# Patient Record
Sex: Male | Born: 1935 | Race: White | Hispanic: No
Health system: Midwestern US, Academic
[De-identification: ages and names within clinical notes are randomized; demographics above are authoritative.]

---

## 2018-01-04 ENCOUNTER — Encounter: Admit: 2018-01-04 | Discharge: 2018-01-04 | Payer: MEDICARE

## 2018-01-04 DIAGNOSIS — I4891 Unspecified atrial fibrillation: Principal | ICD-10-CM

## 2018-01-04 DIAGNOSIS — N400 Enlarged prostate without lower urinary tract symptoms: ICD-10-CM

## 2018-01-04 LAB — COMPREHENSIVE METABOLIC PANEL
Lab: 0.7 mg/dL — ABNORMAL LOW (ref 0.3–1.2)
Lab: 142 MMOL/L — ABNORMAL LOW (ref 137–147)
Lab: 142 U/L — ABNORMAL HIGH (ref 25–110)
Lab: 23 MMOL/L (ref 21–30)
Lab: 3.9 g/dL (ref 3.5–5.0)
Lab: 41 U/L — ABNORMAL HIGH (ref 7–40)
Lab: 56 U/L — ABNORMAL LOW (ref 7–56)
Lab: 8 K/UL (ref 3–12)
Lab: >60 mL/min (ref >60)
Lab: >60 mL/min (ref >60)

## 2018-01-04 LAB — MAGNESIUM: Lab: 2.2 mg/dL — ABNORMAL HIGH (ref 1.6–2.6)

## 2018-01-04 LAB — PROCALCITONIN: Lab: 0 ng/mL (ref <0.11)

## 2018-01-04 LAB — RETICULOCYTE COUNT: Lab: 2.2 % — ABNORMAL HIGH (ref 0.5–2.0)

## 2018-01-04 LAB — TSH WITH FREE T4 REFLEX: Lab: 1.7 uU/mL — ABNORMAL LOW (ref 0.35–5.00)

## 2018-01-04 LAB — PTT (APTT): Lab: 28 s — ABNORMAL LOW (ref 24.0–36.5)

## 2018-01-04 LAB — PHOSPHORUS: Lab: 3.6 mg/dL — ABNORMAL HIGH (ref 2.0–4.5)

## 2018-01-04 LAB — BNP (B-TYPE NATRIURETIC PEPTI): Lab: 546 pg/mL — ABNORMAL HIGH (ref 0–100)

## 2018-01-04 LAB — TROPONIN-I: Lab: 0 ng/mL (ref 0.0–0.05)

## 2018-01-04 LAB — VITAMIN B12: Lab: 324 pg/mL — ABNORMAL LOW (ref 180–914)

## 2018-01-04 LAB — CBC AND DIFF: Lab: 6.5 10*3/uL (ref 4.5–11.0)

## 2018-01-04 LAB — LACTIC ACID (BG - RAPID LACTATE): Lab: 1.4 MMOL/L (ref 0.5–2.0)

## 2018-01-04 LAB — FOLATE, SERUM: Lab: >22 ng/mL — ABNORMAL LOW (ref >3.9)

## 2018-01-04 LAB — IRON + BINDING CAPACITY + %SAT+ FERRITIN: Lab: 38 ug/dL — ABNORMAL LOW (ref 50–185)

## 2018-01-04 LAB — HEMOGLOBIN A1C: Lab: 6 % — ABNORMAL HIGH (ref 4.0–6.0)

## 2018-01-04 LAB — PROTIME INR (PT): Lab: 1.3 M/UL — ABNORMAL HIGH (ref 0.8–1.2)

## 2018-01-04 MED ORDER — FUROSEMIDE 10 MG/ML IJ SOLN
20 mg | Freq: Once | INTRAVENOUS | 0 refills | Status: CP
Start: 2018-01-04 — End: ?
  Administered 2018-01-05: 20 mg via INTRAVENOUS

## 2018-01-04 MED ORDER — SENNOSIDES-DOCUSATE SODIUM 8.6-50 MG PO TAB
2 | Freq: Two times a day (BID) | ORAL | 0 refills | Status: DC
Start: 2018-01-04 — End: 2018-01-09
  Administered 2018-01-05 – 2018-01-08 (×4): 2 via ORAL

## 2018-01-04 MED ORDER — POLYETHYLENE GLYCOL 3350 17 GRAM PO PWPK
2 | Freq: Two times a day (BID) | ORAL | 0 refills | Status: DC
Start: 2018-01-04 — End: 2018-01-09
  Administered 2018-01-05 – 2018-01-07 (×2): 34 g via ORAL

## 2018-01-04 MED ORDER — PERFLUTREN LIPID MICROSPHERES 1.1 MG/ML IV SUSP
1-20 mL | Freq: Once | INTRAVENOUS | 0 refills | Status: CP | PRN
Start: 2018-01-04 — End: ?
  Administered 2018-01-04: 21:00:00 2 mL via INTRAVENOUS

## 2018-01-04 MED ORDER — LIDOCAINE HCL 2 % MM JELP
TOPICAL | 0 refills | Status: DC | PRN
Start: 2018-01-04 — End: 2018-01-09
  Administered 2018-01-04: 23:00:00 2 mL via TOPICAL

## 2018-01-04 MED ORDER — ENOXAPARIN 40 MG/0.4 ML SC SYRG
40 mg | Freq: Every day | SUBCUTANEOUS | 0 refills | Status: DC
Start: 2018-01-04 — End: 2018-01-07
  Administered 2018-01-05 – 2018-01-07 (×3): 40 mg via SUBCUTANEOUS

## 2018-01-05 LAB — CREATININE-URINE RANDOM: Lab: 195 mg/dL (ref >60)

## 2018-01-05 LAB — RVP VIRAL PANEL PCR

## 2018-01-05 LAB — BASIC METABOLIC PANEL: Lab: 144 MMOL/L — ABNORMAL HIGH (ref 137–147)

## 2018-01-05 LAB — UREA NITROGEN-URINE RANDOM: Lab: 879 mg/dL

## 2018-01-05 LAB — TROPONIN-I
Lab: 0 ng/mL (ref 0.0–0.05)
Lab: 0 ng/mL (ref 0.0–0.05)

## 2018-01-05 LAB — PHOSPHORUS: Lab: 3.5 mg/dL — ABNORMAL HIGH (ref 2.0–4.5)

## 2018-01-05 LAB — MAGNESIUM: Lab: 2.2 mg/dL — ABNORMAL LOW (ref >60)

## 2018-01-05 LAB — PSA SCREEN: Lab: 18 ng/mL — ABNORMAL HIGH (ref <6.01)

## 2018-01-05 LAB — CBC AND DIFF: Lab: 5.8 K/UL — ABNORMAL HIGH (ref 4.5–11.0)

## 2018-01-05 LAB — COMPREHENSIVE METABOLIC PANEL: Lab: 145 MMOL/L — ABNORMAL LOW (ref 137–147)

## 2018-01-05 MED ORDER — AMLODIPINE 5 MG PO TAB
5 mg | Freq: Every day | ORAL | 0 refills | Status: DC
Start: 2018-01-05 — End: 2018-01-09
  Administered 2018-01-05 – 2018-01-09 (×5): 5 mg via ORAL

## 2018-01-05 MED ORDER — FUROSEMIDE 10 MG/ML IJ SOLN
20 mg | Freq: Once | INTRAVENOUS | 0 refills | Status: CP
Start: 2018-01-05 — End: ?
  Administered 2018-01-05: 14:00:00 20 mg via INTRAVENOUS

## 2018-01-05 MED ORDER — EMU OIL 120ML
TOPICAL | 0 refills | Status: DC | PRN
Start: 2018-01-05 — End: 2018-01-09
  Administered 2018-01-05: 19:00:00 120.000 mL via TOPICAL

## 2018-01-05 MED ORDER — DICLOFENAC SODIUM 1 % TP GEL
2 g | Freq: Four times a day (QID) | TOPICAL | 0 refills | Status: DC | PRN
Start: 2018-01-05 — End: 2018-01-06
  Administered 2018-01-06: 01:00:00 2 g via TOPICAL

## 2018-01-05 MED ORDER — AMLODIPINE 5 MG PO TAB
5 mg | Freq: Once | ORAL | 0 refills | Status: CP
Start: 2018-01-05 — End: ?
  Administered 2018-01-05: 23:00:00 5 mg via ORAL

## 2018-01-05 MED ORDER — BISACODYL 10 MG RE SUPP
10 mg | Freq: Once | RECTAL | 0 refills | Status: AC
Start: 2018-01-05 — End: ?

## 2018-01-05 MED ORDER — ACETAMINOPHEN 325 MG PO TAB
650 mg | ORAL | 0 refills | Status: DC | PRN
Start: 2018-01-05 — End: 2018-01-09
  Administered 2018-01-05 – 2018-01-09 (×5): 650 mg via ORAL

## 2018-01-06 ENCOUNTER — Inpatient Hospital Stay: Admit: 2018-01-06 | Discharge: 2018-01-06 | Payer: MEDICARE

## 2018-01-06 LAB — URINALYSIS MICROSCOPIC REFLEX TO CULTURE

## 2018-01-06 LAB — URINALYSIS DIPSTICK REFLEX TO CULTURE: Lab: NEGATIVE

## 2018-01-06 LAB — BASIC METABOLIC PANEL: Lab: 142 MMOL/L — ABNORMAL LOW (ref >60)

## 2018-01-06 LAB — CBC AND DIFF: Lab: 6.8 K/UL — ABNORMAL LOW (ref >60)

## 2018-01-06 LAB — PHOSPHORUS: Lab: 3.1 mg/dL — ABNORMAL LOW (ref 2.0–4.5)

## 2018-01-06 LAB — PROCALCITONIN: Lab: 0.1 ng/mL — ABNORMAL HIGH (ref <0.11)

## 2018-01-06 LAB — COMPREHENSIVE METABOLIC PANEL: Lab: 146 MMOL/L — ABNORMAL LOW (ref >60)

## 2018-01-06 LAB — MAGNESIUM: Lab: 1.9 mg/dL — ABNORMAL LOW (ref >60)

## 2018-01-06 MED ORDER — VANCOMYCIN 1G/250ML D5W IVPB (VIAL2BAG)
15 mg/kg | Freq: Once | INTRAVENOUS | 0 refills | Status: CP
Start: 2018-01-06 — End: ?
  Administered 2018-01-06 (×2): 1000 mg via INTRAVENOUS

## 2018-01-06 MED ORDER — MAGNESIUM SULFATE IN D5W 1 GRAM/100 ML IV PGBK
1 g | INTRAVENOUS | 0 refills | Status: CP
Start: 2018-01-06 — End: ?
  Administered 2018-01-06 (×2): 1 g via INTRAVENOUS

## 2018-01-06 MED ORDER — MELATONIN 5 MG PO TAB
5 mg | Freq: Every evening | ORAL | 0 refills | Status: DC
Start: 2018-01-06 — End: 2018-01-09
  Administered 2018-01-07 – 2018-01-09 (×3): 5 mg via ORAL

## 2018-01-06 MED ORDER — VANCOMYCIN 1G/250ML D5W IVPB (VIAL2BAG)
15 mg/kg | INTRAVENOUS | 0 refills | Status: DC
Start: 2018-01-06 — End: 2018-01-07
  Administered 2018-01-07 (×2): 1000 mg via INTRAVENOUS

## 2018-01-06 MED ORDER — POTASSIUM CHLORIDE 20 MEQ PO TBTQ
40 meq | Freq: Once | ORAL | 0 refills | Status: CP
Start: 2018-01-06 — End: ?
  Administered 2018-01-06: 15:00:00 40 meq via ORAL

## 2018-01-06 MED ORDER — HALOPERIDOL LACTATE 5 MG/ML IJ SOLN
1 mg | Freq: Once | INTRAVENOUS | 0 refills | Status: CP
Start: 2018-01-06 — End: ?

## 2018-01-06 MED ORDER — FUROSEMIDE 10 MG/ML IJ SOLN
40 mg | Freq: Once | INTRAVENOUS | 0 refills | Status: CP
Start: 2018-01-06 — End: ?
  Administered 2018-01-06: 40 mg via INTRAVENOUS

## 2018-01-06 MED ORDER — VANCOMYCIN 1G/250ML D5W IVPB (VIAL2BAG)
15 mg/kg | Freq: Two times a day (BID) | INTRAVENOUS | 0 refills | Status: DC
Start: 2018-01-06 — End: 2018-01-06

## 2018-01-06 MED ORDER — PIPERACILLIN/TAZOBACTAM 4.5 G/NS (MB+)(EXTENDED INFUSION)
4.5 g | INTRAVENOUS | 0 refills | Status: DC
Start: 2018-01-06 — End: 2018-01-07
  Administered 2018-01-06 – 2018-01-07 (×8): 4.5 g via INTRAVENOUS

## 2018-01-06 MED ORDER — FUROSEMIDE 10 MG/ML IJ SOLN
40 mg | Freq: Once | INTRAVENOUS | 0 refills | Status: CP
Start: 2018-01-06 — End: ?
  Administered 2018-01-06: 16:00:00 40 mg via INTRAVENOUS

## 2018-01-06 MED ORDER — VANCOMYCIN PHARMACY TO MANAGE
1 | 0 refills | Status: DC
Start: 2018-01-06 — End: 2018-01-07

## 2018-01-06 MED ORDER — VANCOMYCIN RANDOM DOSING
1 | INTRAVENOUS | 0 refills | Status: DC
Start: 2018-01-06 — End: 2018-01-06

## 2018-01-06 MED ORDER — PIPERACILLIN/TAZOBACTAM 4.5 G/NS IVPB (MB+)
4.5 g | Freq: Once | INTRAVENOUS | 0 refills | Status: CP
Start: 2018-01-06 — End: ?
  Administered 2018-01-06 (×2): 4.5 g via INTRAVENOUS

## 2018-01-06 MED ORDER — VANCOMYCIN PHARMACY TO MANAGE
1 | 0 refills | Status: DC
Start: 2018-01-06 — End: 2018-01-06

## 2018-01-06 MED ORDER — LORAZEPAM 2 MG/ML IJ SOLN
.25 mg | Freq: Once | INTRAVENOUS | 0 refills | Status: CP
Start: 2018-01-06 — End: ?

## 2018-01-06 MED ADMIN — HALOPERIDOL LACTATE 5 MG/ML IJ SOLN [3584]: 1 mg | INTRAVENOUS | @ 06:00:00 | Stop: 2018-01-06 | NDC 63323047400

## 2018-01-06 MED ADMIN — LORAZEPAM 2 MG/ML IJ SYRG [86485]: 0.25 mg | INTRAVENOUS | @ 09:00:00 | Stop: 2018-01-06 | NDC 00409198530

## 2018-01-07 LAB — CBC AND DIFF: Lab: 6.9 K/UL — ABNORMAL LOW (ref >60)

## 2018-01-07 LAB — MAGNESIUM: Lab: 2.4 mg/dL — ABNORMAL HIGH (ref 1.6–2.6)

## 2018-01-07 LAB — PHOSPHORUS: Lab: 3.7 mg/dL — ABNORMAL HIGH (ref 2.0–4.5)

## 2018-01-07 LAB — COMPREHENSIVE METABOLIC PANEL
Lab: 109 MMOL/L — ABNORMAL LOW (ref 98–110)
Lab: 145 MMOL/L — ABNORMAL LOW (ref >60)

## 2018-01-07 MED ORDER — ENOXAPARIN 30 MG/0.3 ML SC SYRG
30 mg | Freq: Every day | SUBCUTANEOUS | 0 refills | Status: DC
Start: 2018-01-07 — End: 2018-01-09
  Administered 2018-01-08 – 2018-01-09 (×2): 30 mg via SUBCUTANEOUS

## 2018-01-07 MED ORDER — PIPERACILLIN/TAZOBACTAM 3.375 G/NS IVPB (MB+)
3.375 g | INTRAVENOUS | 0 refills | Status: DC
Start: 2018-01-07 — End: 2018-01-07

## 2018-01-08 LAB — CBC
Lab: 19 % — ABNORMAL HIGH (ref 11–15)
Lab: 220 10*3/uL (ref 150–400)
Lab: 25 pg — ABNORMAL LOW (ref 26–34)
Lab: 29 % — ABNORMAL LOW (ref 40–50)
Lab: 3.5 M/UL — ABNORMAL LOW (ref 4.4–5.5)
Lab: 31 g/dL — ABNORMAL LOW (ref 32.0–36.0)
Lab: 6.2 10*3/uL (ref 4.5–11.0)
Lab: 6.5 FL — ABNORMAL LOW (ref 7–11)
Lab: 81 FL (ref 80–100)
Lab: 9.1 g/dL — ABNORMAL LOW (ref 13.5–16.5)

## 2018-01-08 LAB — MAGNESIUM: Lab: 2.3 mg/dL — ABNORMAL LOW (ref >60)

## 2018-01-08 LAB — COMPREHENSIVE METABOLIC PANEL
Lab: 143 MMOL/L — ABNORMAL LOW (ref >60)
Lab: 35 mg/dL — ABNORMAL HIGH (ref 7–25)
Lab: 94 mg/dL — ABNORMAL LOW (ref >60)

## 2018-01-08 LAB — PHOSPHORUS: Lab: 3.5 mg/dL — ABNORMAL HIGH (ref >60)

## 2018-01-08 LAB — CBC AND DIFF: Lab: 5.2 10*3/uL — ABNORMAL HIGH (ref >60)

## 2018-01-08 MED ORDER — ASPIRIN 81 MG PO CHEW
81 mg | Freq: Every day | ORAL | 0 refills | Status: DC
Start: 2018-01-08 — End: 2018-01-09
  Administered 2018-01-08 – 2018-01-09 (×2): 81 mg via ORAL

## 2018-01-09 ENCOUNTER — Encounter: Admit: 2018-01-09 | Discharge: 2018-01-09 | Payer: MEDICARE

## 2018-01-09 ENCOUNTER — Encounter
Admit: 2018-01-04 | Discharge: 2018-01-09 | Disposition: A | Discharge status: Skilled Nursing Facility | Payer: MEDICARE | Source: Other Acute Inpatient Hospital | Attending: Critical Care Medicine

## 2018-01-09 ENCOUNTER — Ambulatory Visit: Admit: 2018-01-09 | Discharge: 2018-01-10 | Payer: MEDICARE

## 2018-01-09 DIAGNOSIS — N179 Acute kidney failure, unspecified: ICD-10-CM

## 2018-01-09 DIAGNOSIS — R001 Bradycardia, unspecified: Principal | ICD-10-CM

## 2018-01-09 DIAGNOSIS — I700 Atherosclerosis of aorta: ICD-10-CM

## 2018-01-09 DIAGNOSIS — I472 Ventricular tachycardia: Secondary | ICD-10-CM

## 2018-01-09 DIAGNOSIS — K769 Liver disease, unspecified: Secondary | ICD-10-CM

## 2018-01-09 DIAGNOSIS — I110 Hypertensive heart disease with heart failure: Secondary | ICD-10-CM

## 2018-01-09 DIAGNOSIS — D509 Iron deficiency anemia, unspecified: ICD-10-CM

## 2018-01-09 DIAGNOSIS — N401 Enlarged prostate with lower urinary tract symptoms: ICD-10-CM

## 2018-01-09 DIAGNOSIS — R338 Other retention of urine: Secondary | ICD-10-CM

## 2018-01-09 DIAGNOSIS — Z7982 Long term (current) use of aspirin: ICD-10-CM

## 2018-01-09 DIAGNOSIS — N189 Chronic kidney disease, unspecified: Secondary | ICD-10-CM

## 2018-01-09 DIAGNOSIS — I130 Hypertensive heart and chronic kidney disease with heart failure and stage 1 through stage 4 chronic kidney disease, or unspecified chronic kidney disease: Secondary | ICD-10-CM

## 2018-01-09 DIAGNOSIS — I440 Atrioventricular block, first degree: ICD-10-CM

## 2018-01-09 DIAGNOSIS — I5033 Acute on chronic diastolic (congestive) heart failure: ICD-10-CM

## 2018-01-09 DIAGNOSIS — R7303 Prediabetes: ICD-10-CM

## 2018-01-09 DIAGNOSIS — I480 Paroxysmal atrial fibrillation: ICD-10-CM

## 2018-01-09 DIAGNOSIS — K5900 Constipation, unspecified: Secondary | ICD-10-CM

## 2018-01-09 LAB — CBC AND DIFF: Lab: 6.8 K/UL — ABNORMAL HIGH (ref >60)

## 2018-01-09 LAB — MAGNESIUM: Lab: 2.5 mg/dL — ABNORMAL LOW (ref 1.6–2.6)

## 2018-01-09 LAB — PHOSPHORUS: Lab: 3.1 mg/dL — ABNORMAL HIGH (ref 2.0–4.5)

## 2018-01-09 LAB — COMPREHENSIVE METABOLIC PANEL: Lab: 145 MMOL/L — ABNORMAL LOW (ref >60)

## 2018-01-09 MED ORDER — SENNOSIDES-DOCUSATE SODIUM 8.6-50 MG PO TAB
2 | Freq: Two times a day (BID) | ORAL | 0 refills | Status: AC
Start: 2018-01-09 — End: ?

## 2018-01-09 MED ORDER — FUROSEMIDE 40 MG PO TAB
40 mg | ORAL_TABLET | ORAL | 0 refills | 90.00000 days | Status: AC | PRN
Start: 2018-01-09 — End: ?

## 2018-01-09 MED ORDER — MELATONIN 5 MG PO TAB
5 mg | Freq: Every evening | ORAL | 0 refills | 28.00000 days | Status: AC
Start: 2018-01-09 — End: 2018-02-04

## 2018-01-09 MED ORDER — POLYETHYLENE GLYCOL 3350 17 GRAM PO PWPK
34 g | Freq: Two times a day (BID) | ORAL | 0 refills | 18.00000 days | Status: AC
Start: 2018-01-09 — End: ?

## 2018-01-09 MED ORDER — AMLODIPINE 5 MG PO TAB
5 mg | ORAL_TABLET | Freq: Every day | ORAL | 0 refills | Status: AC
Start: 2018-01-09 — End: ?
  Filled 2018-01-09: qty 30, 30d supply

## 2018-01-10 ENCOUNTER — Encounter: Admit: 2018-01-10 | Discharge: 2018-01-10 | Payer: MEDICARE

## 2018-01-12 LAB — CULTURE-BLOOD W/SENSITIVITY

## 2018-01-17 ENCOUNTER — Encounter: Admit: 2018-01-17 | Discharge: 2018-01-17 | Payer: MEDICARE

## 2018-01-17 DIAGNOSIS — I4891 Unspecified atrial fibrillation: Secondary | ICD-10-CM

## 2018-01-17 DIAGNOSIS — N400 Enlarged prostate without lower urinary tract symptoms: Secondary | ICD-10-CM

## 2018-01-28 ENCOUNTER — Encounter: Admit: 2018-01-28 | Discharge: 2018-01-28 | Payer: MEDICARE

## 2018-01-28 ENCOUNTER — Ambulatory Visit: Admit: 2018-01-28 | Discharge: 2018-01-29 | Payer: MEDICARE

## 2018-01-28 DIAGNOSIS — N400 Enlarged prostate without lower urinary tract symptoms: Secondary | ICD-10-CM

## 2018-01-28 DIAGNOSIS — I4891 Unspecified atrial fibrillation: Secondary | ICD-10-CM

## 2018-01-28 DIAGNOSIS — R001 Bradycardia, unspecified: Secondary | ICD-10-CM

## 2018-01-28 DIAGNOSIS — I5033 Acute on chronic diastolic (congestive) heart failure: Secondary | ICD-10-CM

## 2018-01-28 DIAGNOSIS — I480 Paroxysmal atrial fibrillation: Secondary | ICD-10-CM

## 2018-01-28 DIAGNOSIS — Z136 Encounter for screening for cardiovascular disorders: Secondary | ICD-10-CM

## 2018-01-30 ENCOUNTER — Encounter: Admit: 2018-01-30 | Discharge: 2018-01-30 | Payer: MEDICARE

## 2018-01-30 LAB — BASIC METABOLIC PANEL
Lab: 1 — ABNORMAL LOW (ref 30–34)
Lab: 108 — ABNORMAL HIGH (ref 65–99)
Lab: 110 — ABNORMAL HIGH (ref 98–107)
Lab: 141 — ABNORMAL LOW (ref 4.70–6.10)
Lab: 22 — ABNORMAL LOW (ref 23–31)
Lab: 30 — ABNORMAL HIGH (ref 8.4–25.7)
Lab: 4.3 — ABNORMAL LOW (ref 14–18)
Lab: 68 — ABNORMAL HIGH (ref 11.5–14.5)
Lab: 9

## 2018-01-30 LAB — CBC: Lab: 4.2 — ABNORMAL LOW (ref 4.8–10.8)

## 2018-01-30 LAB — MAGNESIUM: Lab: 2.2

## 2018-01-31 ENCOUNTER — Encounter: Admit: 2018-01-31 | Discharge: 2018-01-31 | Payer: MEDICARE

## 2018-02-03 ENCOUNTER — Encounter: Admit: 2018-02-03 | Discharge: 2018-02-03 | Payer: MEDICARE

## 2018-02-04 ENCOUNTER — Encounter: Admit: 2018-02-04 | Discharge: 2018-02-04 | Payer: MEDICARE

## 2018-02-04 ENCOUNTER — Ambulatory Visit: Admit: 2018-02-04 | Discharge: 2018-02-05 | Payer: MEDICARE

## 2018-02-04 DIAGNOSIS — N400 Enlarged prostate without lower urinary tract symptoms: Secondary | ICD-10-CM

## 2018-02-04 DIAGNOSIS — I4891 Unspecified atrial fibrillation: Secondary | ICD-10-CM

## 2018-02-04 DIAGNOSIS — R001 Bradycardia, unspecified: Secondary | ICD-10-CM

## 2018-02-04 MED ORDER — CEFAZOLIN INJ 1GM IVP
2 g | Freq: Once | INTRAVENOUS | 0 refills | Status: CN
Start: 2018-02-04 — End: ?

## 2018-02-04 MED ORDER — CEFAZOLIN INJ 1GM IVP
1 g | INTRAVENOUS | 0 refills | Status: CN
Start: 2018-02-04 — End: ?

## 2018-02-04 MED ORDER — LIDOCAINE (PF) 10 MG/ML (1 %) IJ SOLN
.1-2 mL | INTRAMUSCULAR | 0 refills | Status: CN | PRN
Start: 2018-02-04 — End: ?

## 2018-02-04 MED ORDER — SODIUM CHLORIDE 0.9 % IV SOLP
INTRAVENOUS | 0 refills | Status: CN
Start: 2018-02-04 — End: ?

## 2018-02-05 DIAGNOSIS — I491 Atrial premature depolarization: Secondary | ICD-10-CM

## 2018-02-05 DIAGNOSIS — R001 Bradycardia, unspecified: Secondary | ICD-10-CM

## 2018-02-05 DIAGNOSIS — R5383 Other fatigue: Secondary | ICD-10-CM

## 2018-02-05 DIAGNOSIS — I480 Paroxysmal atrial fibrillation: Secondary | ICD-10-CM

## 2018-02-05 DIAGNOSIS — I10 Essential (primary) hypertension: Secondary | ICD-10-CM

## 2018-02-05 DIAGNOSIS — I495 Sick sinus syndrome: Secondary | ICD-10-CM

## 2018-02-05 DIAGNOSIS — I5032 Chronic diastolic (congestive) heart failure: Secondary | ICD-10-CM

## 2018-02-06 ENCOUNTER — Encounter: Admit: 2018-02-06 | Discharge: 2018-02-06 | Payer: MEDICARE

## 2018-02-06 MED ORDER — MAGNESIUM HYDROXIDE 2,400 MG/10 ML PO SUSP
10 mL | ORAL | 0 refills | Status: CN | PRN
Start: 2018-02-06 — End: ?

## 2018-02-06 MED ORDER — ACETAMINOPHEN 325 MG PO TAB
650 mg | ORAL | 0 refills | Status: CN | PRN
Start: 2018-02-06 — End: ?

## 2018-02-10 ENCOUNTER — Encounter: Admit: 2018-02-10 | Discharge: 2018-02-10 | Payer: MEDICARE

## 2018-02-10 ENCOUNTER — Encounter: Admit: 2018-02-10 | Discharge: 2018-02-11 | Payer: MEDICARE

## 2018-02-10 DIAGNOSIS — I4891 Unspecified atrial fibrillation: Principal | ICD-10-CM

## 2018-02-10 DIAGNOSIS — N400 Enlarged prostate without lower urinary tract symptoms: ICD-10-CM

## 2018-02-10 MED ORDER — MAGNESIUM HYDROXIDE 2,400 MG/10 ML PO SUSP
10 mL | ORAL | 0 refills | Status: DC | PRN
Start: 2018-02-10 — End: 2018-02-11

## 2018-02-10 MED ORDER — ACETAMINOPHEN 325 MG PO TAB
650 mg | ORAL | 0 refills | Status: DC | PRN
Start: 2018-02-10 — End: 2018-02-11
  Administered 2018-02-10: 23:00:00 650 mg via ORAL

## 2018-02-10 MED ORDER — ASPIRIN 81 MG PO TBEC
81 mg | Freq: Every day | ORAL | 0 refills | Status: DC
Start: 2018-02-10 — End: 2018-02-11
  Administered 2018-02-10: 23:00:00 81 mg via ORAL

## 2018-02-10 MED ORDER — SODIUM CHLORIDE 0.9 % IV SOLP
INTRAVENOUS | 0 refills | Status: DC
Start: 2018-02-10 — End: 2018-02-11
  Administered 2018-02-10: 16:00:00 1000 mL via INTRAVENOUS

## 2018-02-10 MED ORDER — CEFAZOLIN INJ 1GM IVP
2 g | Freq: Once | INTRAVENOUS | 0 refills | Status: CP
Start: 2018-02-10 — End: ?

## 2018-02-10 MED ORDER — CEFAZOLIN INJ 1GM IVP
1 g | INTRAVENOUS | 0 refills | Status: CP
Start: 2018-02-10 — End: ?
  Administered 2018-02-11: 03:00:00 1 g via INTRAVENOUS

## 2018-02-10 MED ORDER — AMLODIPINE 5 MG PO TAB
5 mg | Freq: Two times a day (BID) | ORAL | 0 refills | Status: DC
Start: 2018-02-10 — End: 2018-02-11
  Administered 2018-02-10: 23:00:00 5 mg via ORAL

## 2018-02-10 MED ORDER — LIDOCAINE (PF) 10 MG/ML (1 %) IJ SOLN
.1-2 mL | INTRAMUSCULAR | 0 refills | Status: DC | PRN
Start: 2018-02-10 — End: 2018-02-11

## 2018-02-10 MED ORDER — POLYETHYLENE GLYCOL 3350 17 GRAM PO PWPK
34 g | Freq: Two times a day (BID) | ORAL | 0 refills | Status: DC
Start: 2018-02-10 — End: 2018-02-11

## 2018-02-10 MED ORDER — SENNOSIDES-DOCUSATE SODIUM 8.6-50 MG PO TAB
2 | Freq: Two times a day (BID) | ORAL | 0 refills | Status: DC
Start: 2018-02-10 — End: 2018-02-11
  Administered 2018-02-11: 03:00:00 2 via ORAL

## 2018-02-11 ENCOUNTER — Encounter: Admit: 2018-02-11 | Discharge: 2018-02-12 | Payer: MEDICARE

## 2018-02-11 ENCOUNTER — Encounter: Admit: 2018-02-11 | Discharge: 2018-02-11 | Payer: MEDICARE

## 2018-02-11 DIAGNOSIS — I495 Sick sinus syndrome: Principal | ICD-10-CM

## 2018-02-11 DIAGNOSIS — R55 Syncope and collapse: Secondary | ICD-10-CM

## 2018-02-11 DIAGNOSIS — R001 Bradycardia, unspecified: Secondary | ICD-10-CM

## 2018-02-11 LAB — BASIC METABOLIC PANEL
Lab: 0.9 mg/dL (ref 0.4–1.24)
Lab: 109 MMOL/L — ABNORMAL LOW (ref 98–110)
Lab: 139 MMOL/L — ABNORMAL LOW (ref 137–147)
Lab: 4.2 MMOL/L — ABNORMAL LOW (ref 3.5–5.1)
Lab: 5 pg (ref 3–12)
Lab: 8.2 mg/dL — ABNORMAL LOW (ref 8.5–10.6)
Lab: 85 mg/dL (ref 70–100)

## 2018-02-11 LAB — CBC
Lab: 21 % — ABNORMAL HIGH (ref 11–15)
Lab: 4.5 10*3/uL (ref 4.5–11.0)
Lab: 83 FL (ref 80–100)

## 2018-02-19 ENCOUNTER — Encounter: Admit: 2018-02-19 | Discharge: 2018-02-19 | Payer: MEDICARE

## 2018-02-21 ENCOUNTER — Encounter: Admit: 2018-02-21 | Discharge: 2018-02-21 | Payer: MEDICARE

## 2018-02-21 NOTE — Progress Notes
Removed Silverlon dressing. (Left or right) prepectoral incision is clean, dry, well approximated, and healing without evidence of drainage or discharge. Steri-Strips dry and intact. Pt reports no adverse symptoms. Incision care, including signs and symptoms of infection, reviewed(as below). Pt verbalized understanding and will remain in phone contact.    You may shower once dressing removed at follow up appointment; however avoid direct contact with the incision (allow the water to hit the back of your shoulder rather than directly on the incision).    Do not submerge incision in tub, pool, hot tub, or lake for 4 weeks.    Unless your incision is bleeding or draining, keep it open to air.    Avoid applying deodorants, powders, creams, lotions, etc. to your incision for 4 weeks.    Usually there are no stitches to be removed. Steri-strips will begin to fall off in 10-14 days. If they remain after 2 weeks, gently remove them when they are damp after a shower.    Your incision should gradually look better each day. Please notify our office immediately if you notice any of the following:   -an increase in swelling or redness   -any drainage   -increasing pain at the incision site  -fever over 100 degree

## 2018-03-20 ENCOUNTER — Encounter: Admit: 2018-03-20 | Discharge: 2018-03-20 | Payer: MEDICARE

## 2018-03-20 DIAGNOSIS — I480 Paroxysmal atrial fibrillation: ICD-10-CM

## 2018-03-20 DIAGNOSIS — Z950 Presence of cardiac pacemaker: ICD-10-CM

## 2018-03-20 DIAGNOSIS — I5032 Chronic diastolic (congestive) heart failure: ICD-10-CM

## 2018-03-20 DIAGNOSIS — I495 Sick sinus syndrome: Principal | ICD-10-CM

## 2018-03-24 ENCOUNTER — Ambulatory Visit: Admit: 2018-03-24 | Discharge: 2018-03-24 | Payer: MEDICARE

## 2018-03-24 ENCOUNTER — Encounter: Admit: 2018-03-24 | Discharge: 2018-03-24 | Payer: MEDICARE

## 2018-03-24 DIAGNOSIS — I5032 Chronic diastolic (congestive) heart failure: ICD-10-CM

## 2018-03-24 DIAGNOSIS — Z950 Presence of cardiac pacemaker: ICD-10-CM

## 2018-03-24 DIAGNOSIS — I495 Sick sinus syndrome: Principal | ICD-10-CM

## 2018-03-24 DIAGNOSIS — I480 Paroxysmal atrial fibrillation: ICD-10-CM

## 2018-03-26 ENCOUNTER — Encounter: Admit: 2018-03-26 | Discharge: 2018-03-26 | Payer: MEDICARE

## 2018-03-26 MED ORDER — APIXABAN 5 MG PO TAB
5 mg | ORAL_TABLET | Freq: Two times a day (BID) | ORAL | 11 refills | Status: AC
Start: 2018-03-26 — End: 2018-04-24

## 2018-04-02 ENCOUNTER — Encounter: Admit: 2018-04-02 | Discharge: 2018-04-02 | Payer: MEDICARE

## 2018-04-17 ENCOUNTER — Encounter: Admit: 2018-04-17 | Discharge: 2018-04-17 | Payer: MEDICARE

## 2018-04-21 ENCOUNTER — Encounter: Admit: 2018-04-21 | Discharge: 2018-04-21 | Payer: MEDICARE

## 2018-04-23 ENCOUNTER — Encounter: Admit: 2018-04-23 | Discharge: 2018-04-23 | Payer: MEDICARE

## 2018-04-24 ENCOUNTER — Ambulatory Visit: Admit: 2018-04-24 | Discharge: 2018-04-25 | Payer: MEDICARE

## 2018-04-24 ENCOUNTER — Encounter: Admit: 2018-04-24 | Discharge: 2018-04-24 | Payer: MEDICARE

## 2018-04-24 DIAGNOSIS — Z950 Presence of cardiac pacemaker: Principal | ICD-10-CM

## 2018-04-24 DIAGNOSIS — I4891 Unspecified atrial fibrillation: Principal | ICD-10-CM

## 2018-04-24 DIAGNOSIS — I480 Paroxysmal atrial fibrillation: ICD-10-CM

## 2018-04-24 DIAGNOSIS — I495 Sick sinus syndrome: ICD-10-CM

## 2018-04-24 DIAGNOSIS — N400 Enlarged prostate without lower urinary tract symptoms: ICD-10-CM

## 2018-04-24 DIAGNOSIS — I5032 Chronic diastolic (congestive) heart failure: ICD-10-CM

## 2018-04-24 NOTE — Progress Notes
Date of Service: 04/24/2018    Edward Santana is a 85 y.o. male.       HPI     Edward Santana was on Zoom today with his daughter from [place].  He had a pacemaker implanted a couple of months ago for what we thought was symptomatic sinus node dysfunction.  He did fine with the procedure and the incision has healed normally.      Edward Santana is a bit of a reluctant patient and I can't get him to say that he feels any different after the implant.  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  He's entirely unaware of his atrial fibrillation.  He hasn't had any TIA or stroke symptoms.  He still adamantly refuses to consider oral anti-coagulation.         Vitals:    04/24/18 1431   BP: 128/75   BP Source: Arm, Left Upper   Pulse: 94   SpO2: 97%   Weight: 64.9 kg (143 lb)   Height: 1.727 m (5' 8)   PainSc: Zero     Body mass index is 21.74 kg/m???.     Past Medical History  Patient Active Problem List    Diagnosis Date Noted   ??? Sinus node dysfunction (HCC) 02/04/2018     02/10/2018: Dual chamber pacemaker placement under moderate sedation (left chest St Jude) by Dr Bernette Mayers     ??? Screening for cardiovascular condition 01/28/2018     08/2016 - MPI @ [facility]:  EF 63%.  No ischemia.     ??? Bradycardia 01/04/2018     02/10/2018: Dual chamber pacemaker placement under moderate sedation (left chest St Jude) by Dr Bernette Mayers     ??? Atrial fibrillation [facility]) 01/04/2018     08/2016 - EKG @ [facility] showed AF, rate 90  01/2017 - AF noted during [facility] OV.  A/C refused.  07/2017 - AF during office visit at [facility], patient refused A/C     ??? Chronic diastolic heart failure [facility]) 01/04/2018     08/2016 - Echo @ [facility]:  EF 55%.  Mild MR.  PAP 26.  LA size normal     ??? HTN (hypertension) 01/04/2018   ??? Pre-diabetes 01/04/2018   ??? Iron deficiency anemia 01/04/2018         Review of Systems   Constitution: Negative.

## 2018-06-10 ENCOUNTER — Encounter: Admit: 2018-06-10 | Discharge: 2018-06-10

## 2018-06-10 ENCOUNTER — Ambulatory Visit: Admit: 2018-06-10 | Discharge: 2018-06-10

## 2018-06-10 DIAGNOSIS — I480 Paroxysmal atrial fibrillation: Secondary | ICD-10-CM

## 2018-06-10 DIAGNOSIS — Z950 Presence of cardiac pacemaker: Secondary | ICD-10-CM

## 2018-06-10 DIAGNOSIS — I5032 Chronic diastolic (congestive) heart failure: Secondary | ICD-10-CM

## 2018-06-10 DIAGNOSIS — I495 Sick sinus syndrome: Principal | ICD-10-CM

## 2018-09-09 ENCOUNTER — Encounter: Admit: 2018-09-09 | Discharge: 2018-09-09

## 2018-09-09 DIAGNOSIS — I480 Paroxysmal atrial fibrillation: Secondary | ICD-10-CM

## 2018-09-09 DIAGNOSIS — Z950 Presence of cardiac pacemaker: Secondary | ICD-10-CM

## 2018-09-09 DIAGNOSIS — I495 Sick sinus syndrome: Secondary | ICD-10-CM

## 2018-09-10 ENCOUNTER — Ambulatory Visit: Admit: 2018-09-09 | Discharge: 2018-09-10

## 2018-09-10 DIAGNOSIS — I5032 Chronic diastolic (congestive) heart failure: Secondary | ICD-10-CM

## 2018-10-23 ENCOUNTER — Encounter: Admit: 2018-10-23 | Discharge: 2018-10-23 | Payer: MEDICARE

## 2018-10-23 ENCOUNTER — Ambulatory Visit: Admit: 2018-10-23 | Discharge: 2018-10-23 | Payer: MEDICARE

## 2018-10-23 DIAGNOSIS — Z950 Presence of cardiac pacemaker: Secondary | ICD-10-CM

## 2018-11-18 ENCOUNTER — Encounter: Admit: 2018-11-18 | Discharge: 2018-11-18 | Payer: MEDICARE

## 2018-11-18 DIAGNOSIS — I480 Paroxysmal atrial fibrillation: Secondary | ICD-10-CM

## 2018-11-18 DIAGNOSIS — I5032 Chronic diastolic (congestive) heart failure: Secondary | ICD-10-CM

## 2018-11-18 DIAGNOSIS — N400 Enlarged prostate without lower urinary tract symptoms: Secondary | ICD-10-CM

## 2018-11-18 DIAGNOSIS — Z950 Presence of cardiac pacemaker: Secondary | ICD-10-CM

## 2018-11-18 DIAGNOSIS — I10 Essential (primary) hypertension: Secondary | ICD-10-CM

## 2018-11-18 DIAGNOSIS — I4891 Unspecified atrial fibrillation: Secondary | ICD-10-CM

## 2018-11-18 NOTE — Assessment & Plan Note
Blood pressure appears to be fine on the current medical program.

## 2018-11-18 NOTE — Assessment & Plan Note
No problems with dyspnea or peripheral edema.  Stable volume status.

## 2018-11-18 NOTE — Progress Notes
Date of Service: 11/18/2018    Derrich Gaby is a 85 y.o. male.       HPI     Lacorey was in the [place] clinic today with a daughter-in-law.  He is as reserved and rather aloof as always, but denies any problems with lightheadedness or syncope.  He has had no TIA or stroke symptoms.  He denies any palpitations or chest discomfort.         Vitals:    11/18/18 0852 11/18/18 0859   BP: 130/78 132/84   BP Source: Arm, Left Upper Arm, Right Upper   Pulse: 72    Temp: 36.4 ?C (97.5 ?F)    SpO2: 98%    Weight: 59.8 kg (131 lb 12.8 oz)    Height: 1.727 m (5' 8)    PainSc: Zero      Body mass index is 20.04 kg/m?Marland Kitchen     Past Medical History  Patient Active Problem List    Diagnosis Date Noted   ? Cardiac pacemaker 02/04/2018     02/10/2018: Dual chamber pacemaker placement under moderate sedation (left chest St Jude) by Dr Bernette Mayers     ? Screening for cardiovascular condition 01/28/2018     08/2016 - MPI @ [facility]:  EF 63%.  No ischemia.     ? Bradycardia 01/04/2018     02/10/2018: Dual chamber pacemaker placement under moderate sedation (left chest St Jude) by Dr Bernette Mayers     ? Atrial fibrillation (HCC) 01/04/2018     08/2016 - EKG @ [facility] showed AF, rate 90  01/2017 - AF noted during [facility] OV.  A/C refused.  07/2017 - AF during office visit at [facility], patient refused A/C     ? Chronic diastolic heart failure (HCC) 01/04/2018     08/2016 - Echo @ [facility]:  EF 55%.  Mild MR.  PAP 26.  LA size normal     ? HTN (hypertension) 01/04/2018   ? Pre-diabetes 01/04/2018   ? Iron deficiency anemia 01/04/2018         Review of Systems   Constitution: Negative.   HENT: Negative.    Eyes: Negative.    Cardiovascular: Negative.    Respiratory: Negative.    Endocrine: Negative.    Hematologic/Lymphatic: Negative.    Skin: Negative.    Musculoskeletal: Positive for arthritis and joint pain.   Gastrointestinal: Negative.    Genitourinary: Negative.    Neurological: Negative. Psychiatric/Behavioral: Negative.    Allergic/Immunologic: Negative.        Physical Exam    Physical Exam   General Appearance: no distress   Skin: warm, no ulcers or xanthomas   Digits and Nails: no cyanosis or clubbing   Eyes: conjunctivae and lids normal, pupils are equal and round   Teeth/Gums/Palate: dentition unremarkable, no lesions   Lips & Oral Mucosa: no pallor or cyanosis   Neck Veins: normal JVP , neck veins are not distended   Thyroid: no nodules, masses, tenderness or enlargement   Chest Inspection: chest is normal in appearance   Respiratory Effort: breathing comfortably, no respiratory distress   Auscultation/Percussion: lungs clear to auscultation, no rales or rhonchi, no wheezing   PMI: PMI not enlarged or displaced   Cardiac Rhythm: regular rhythm and normal rate   Cardiac Auscultation: S1, S2 normal, no rub, no gallop   Murmurs: no murmur   Peripheral Circulation: normal peripheral circulation   Carotid Arteries: normal carotid upstroke bilaterally, no bruits  Radial Arteries: normal symmetric radial pulses   Abdominal Aorta: no abdominal aortic bruit   Pedal Pulses: normal symmetric pedal pulses   Lower Extremity Edema: no lower extremity edema   Abdominal Exam: soft, non-tender, no masses, bowel sounds normal   Liver & Spleen: no organomegaly   Gait & Station: walks without assistance   Muscle Strength: normal muscle tone   Orientation: oriented to time, place and person   Affect & Mood: appropriate and sustained affect   Language and Memory: patient responsive and seems to comprehend information   Neurologic Exam: neurological assessment grossly intact   Other: moves all extremities      Problems Addressed Today  Encounter Diagnoses   Name Primary?   ? Chronic diastolic heart failure (HCC)    ? Paroxysmal atrial fibrillation (HCC)    ? Essential hypertension    ? Cardiac pacemaker        Assessment and Plan       Chronic diastolic heart failure (HCC) No problems with dyspnea or peripheral edema.  Stable volume status.    Atrial fibrillation [facility])  He is in atrial fibrillation about 70% of the time according to his pacemaker check but he has consistently refused anticoagulation.    HTN (hypertension)  Blood pressure appears to be fine on the current medical program.    Cardiac pacemaker  He had a pacemaker check about a month ago that shows normal device function.      Current Medications (including today's revisions)  ? acetaminophen SR (TYLENOL) 650 mg tablet Take 650 mg by mouth daily.   ? amLODIPine (NORVASC) 5 mg tablet Take one tablet by mouth daily.   ? aspirin EC 81 mg tablet Take 81 mg by mouth daily. Take with food.   ? CALCIUM PO Take 1 tablet by mouth daily.   ? cholecalciferol (VITAMIN D-3) 125 mcg (5,000 unit) tablet Take 5,000 Units by mouth every 7 days.   ? diclofenac (VOLTAREN) 1 % topical gel Apply  topically to affected area twice daily.   ? ferrous sulfate (FEOSOL) 325 mg (65 mg iron) tablet Take 325 mg by mouth twice daily. Take on an empty stomach at least 1 hour before or 2 hours after food.   ? furosemide (LASIX) 40 mg tablet Take one tablet by mouth as Needed. For weight gain of 2 lbs in one day or 5 pounds over 3 days   ? MULTIVITAMIN PO Take 1 tablet by mouth daily.   ? polyethylene glycol 3350 (MIRALAX) 17 g packet Take two packets by mouth twice daily. (Patient taking differently: Take 17 g by mouth daily.)   ? potassium chloride SR (K-DUR) 20 mEq tablet Take 20 mEq by mouth as Needed (Take 1 tablet if taking Lasix). Take with a meal and a full glass of water.   ? senna/docusate (SENOKOT-S) 8.6/50 mg tablet Take two tablets by mouth twice daily.

## 2018-11-18 NOTE — Assessment & Plan Note
He had a pacemaker check about a month ago that shows normal device function.

## 2018-11-18 NOTE — Assessment & Plan Note
He is in atrial fibrillation about 70% of the time according to his pacemaker check but he has consistently refused anticoagulation.

## 2019-01-21 ENCOUNTER — Encounter: Admit: 2019-01-21 | Discharge: 2019-01-21 | Payer: MEDICARE

## 2019-01-23 ENCOUNTER — Ambulatory Visit: Admit: 2019-01-23 | Discharge: 2019-01-23 | Payer: MEDICARE

## 2019-01-23 DIAGNOSIS — Z950 Presence of cardiac pacemaker: Secondary | ICD-10-CM

## 2019-01-23 DIAGNOSIS — I495 Sick sinus syndrome: Secondary | ICD-10-CM

## 2019-01-23 DIAGNOSIS — I480 Paroxysmal atrial fibrillation: Secondary | ICD-10-CM

## 2019-04-22 ENCOUNTER — Encounter: Admit: 2019-04-22 | Discharge: 2019-04-22 | Payer: MEDICARE

## 2019-07-22 ENCOUNTER — Encounter: Admit: 2019-07-22 | Discharge: 2019-07-22 | Payer: MEDICARE

## 2019-10-20 ENCOUNTER — Encounter: Admit: 2019-10-20 | Discharge: 2019-10-20 | Payer: MEDICARE

## 2020-01-19 NOTE — Assessment & Plan Note
His pacemaker was interrogated by remote transmission in mid October and his estimated battery longevity is about 9 years.  Usually when the device emits an audible warning it has to do with battery longevity.  His granddaughter is not even certain that the sound came from his pacemaker.

## 2020-01-19 NOTE — Assessment & Plan Note
His fluid status seems to be stable and he denies any symptoms related to heart failure.

## 2020-01-19 NOTE — Assessment & Plan Note
His pacemaker shows that he remains in atrial fibrillation.  He refuses anticoagulation.  He refuses a left atrial appendage closure device.  He is apparently asymptomatic.

## 2020-01-19 NOTE — Progress Notes
Date of Service: 01/19/2020    Edward Santana is a 87 y.o. male.       HPI     Edward Santana was in the [place] clinic today for follow-up regarding his permanent atrial fibrillation and pacemaker.  He is unvaccinated.  He may have had COVID in July when the rest of his family was ill.    His granddaughter accompanies him today and does not indicate any problems that he is experienced.    She says that he was walking into a football game in September, exertion that was definitely more than what he usually performs, and she thought that she heard his pacemaker making a beeping sound.  He did not seem to have any symptoms at the time and she is never heard the sound since then.    As always, he has very little to say and denies any sort of symptoms such as breathlessness, peripheral edema, or chest discomfort.  He denies any syncope or near syncope and he has had no TIA or stroke symptoms that he will admit to.       Vitals:    01/19/20 0821   BP: 132/72   BP Source: Arm, Left Upper   Patient Position: Sitting   Pulse: 82   SpO2: 97%   Weight: 59.1 kg (130 lb 6.4 oz)   Height: 1.575 m (5' 2)   PainSc: Zero     Body mass index is 23.85 kg/m?Marland Kitchen     Past Medical History  Patient Active Problem List    Diagnosis Date Noted   ? Cardiac pacemaker 02/04/2018     02/10/2018: Dual chamber pacemaker placement under moderate sedation (left chest St Jude) by Dr Bernette Mayers     ? Screening for cardiovascular condition 01/28/2018     08/2016 - MPI @ [facility]:  EF 63%.  No ischemia.     ? Bradycardia 01/04/2018     02/10/2018: Dual chamber pacemaker placement under moderate sedation (left chest St Jude) by Dr Bernette Mayers     ? Atrial fibrillation (HCC) 01/04/2018     08/2016 - EKG @ [facility] showed AF, rate 90  01/2017 - AF noted during [facility] OV.  A/C refused.  07/2017 - AF during office visit at [facility], patient refused A/C     ? Chronic diastolic heart failure (HCC) 01/04/2018     08/2016 - Echo @ [facility]:  EF 55%.  Mild MR.  PAP 26.  LA size normal     ? HTN (hypertension) 01/04/2018   ? Pre-diabetes 01/04/2018   ? Iron deficiency anemia 01/04/2018         Review of Systems   Constitutional: Negative.   HENT: Negative.    Eyes: Negative.    Cardiovascular: Negative.    Respiratory: Negative.    Endocrine: Negative.    Hematologic/Lymphatic: Negative.    Skin: Negative.    Musculoskeletal: Negative.    Gastrointestinal: Negative.    Genitourinary: Negative.    Neurological: Negative.    Psychiatric/Behavioral: Negative.    Allergic/Immunologic: Negative.        Physical Exam    Physical Exam   General Appearance: no distress   Skin: warm, no ulcers or xanthomas   Digits and Nails: no cyanosis or clubbing   Eyes: conjunctivae and lids normal, pupils are equal and round   Teeth/Gums/Palate: dentition unremarkable, no lesions   Lips & Oral Mucosa: no pallor or cyanosis   Neck Veins: normal JVP ,  neck veins are not distended   Thyroid: no nodules, masses, tenderness or enlargement   Chest Inspection: chest is normal in appearance   Respiratory Effort: breathing comfortably, no respiratory distress   Auscultation/Percussion: lungs clear to auscultation, no rales or rhonchi, no wheezing   PMI: PMI not enlarged or displaced   Cardiac Rhythm: regular rhythm and normal rate   Cardiac Auscultation: S1, S2 normal, no rub, no gallop   Murmurs: no murmur   Peripheral Circulation: normal peripheral circulation   Carotid Arteries: normal carotid upstroke bilaterally, no bruits   Radial Arteries: normal symmetric radial pulses   Abdominal Aorta: no abdominal aortic bruit   Pedal Pulses: normal symmetric pedal pulses   Lower Extremity Edema: no lower extremity edema   Abdominal Exam: soft, non-tender, no masses, bowel sounds normal   Liver & Spleen: no organomegaly   Gait & Station: walks without assistance   Muscle Strength: normal muscle tone   Orientation: oriented to time, place and person   Affect & Mood: appropriate and sustained affect   Language and Memory: patient responsive and seems to comprehend information   Neurologic Exam: neurological assessment grossly intact   Other: moves all extremities  His device shows that he remains in atrial fibrillation.  He refuses anticoagulation.    Cardiovascular Health Factors  Vitals BP Readings from Last 3 Encounters:   01/19/20 132/72   11/18/18 132/84   04/24/18 128/75     Wt Readings from Last 3 Encounters:   01/19/20 59.1 kg (130 lb 6.4 oz)   11/18/18 59.8 kg (131 lb 12.8 oz)   04/24/18 64.9 kg (143 lb)     BMI Readings from Last 3 Encounters:   01/19/20 23.85 kg/m?   11/18/18 20.04 kg/m?   04/24/18 21.74 kg/m?      Smoking Social History     Tobacco Use   Smoking Status Never Smoker   Smokeless Tobacco Never Used      Lipid Profile Cholesterol   Date Value Ref Range Status   01/06/2020 178  Final     HDL   Date Value Ref Range Status   01/06/2020 47  Final     LDL   Date Value Ref Range Status   01/06/2020 115 (H) <100 Final     Triglycerides   Date Value Ref Range Status   01/06/2020 80  Final      Blood Sugar Hemoglobin A1C   Date Value Ref Range Status   01/04/2018 6.0 4.0 - 6.0 % Final     Comment:     The ADA recommends that most patients with type 1 and type 2 diabetes maintain   an A1c level <7%.       Glucose   Date Value Ref Range Status   01/06/2020 104  Final   02/11/2018 85 70 - 100 MG/DL Final   16/10/9602 540 (H) 65 - 99 Final          Problems Addressed Today  Encounter Diagnoses   Name Primary?   ? Chronic diastolic heart failure (HCC) Yes   ? Paroxysmal atrial fibrillation (HCC)    ? Bradycardia    ? Cardiac pacemaker    ? Primary hypertension        Assessment and Plan       Atrial fibrillation [facility])  His pacemaker shows that he remains in atrial fibrillation.  He refuses anticoagulation.  He refuses a left atrial appendage closure device.  He is apparently asymptomatic.  Chronic diastolic heart failure (HCC)  His fluid status seems to be stable and he denies any symptoms related to heart failure.    HTN (hypertension)  His blood pressure looks fine today.    Cardiac pacemaker  His pacemaker was interrogated by remote transmission in mid October and his estimated battery longevity is about 9 years.  Usually when the device emits an audible warning it has to do with battery longevity.  His granddaughter is not even certain that the sound came from his pacemaker.      Current Medications (including today's revisions)  ? acetaminophen SR (TYLENOL) 650 mg tablet Take 650 mg by mouth daily.   ? amLODIPine (NORVASC) 5 mg tablet Take one tablet by mouth daily.   ? aspirin EC 81 mg tablet Take 81 mg by mouth daily. Take with food.   ? CALCIUM PO Take 1 tablet by mouth daily.   ? cholecalciferol (VITAMIN D-3) 125 mcg (5,000 unit) tablet Take 5,000 Units by mouth every 7 days.   ? diclofenac (VOLTAREN) 1 % topical gel Apply  topically to affected area twice daily.   ? ferrous sulfate (FEOSOL) 325 mg (65 mg iron) tablet Take 325 mg by mouth twice daily. Take on an empty stomach at least 1 hour before or 2 hours after food.   ? furosemide (LASIX) 40 mg tablet Take one tablet by mouth as Needed. For weight gain of 2 lbs in one day or 5 pounds over 3 days   ? MULTIVITAMIN PO Take 1 tablet by mouth daily.   ? polyethylene glycol 3350 (MIRALAX) 17 g packet Take two packets by mouth twice daily. (Patient taking differently: Take 17 g by mouth daily.)   ? potassium chloride SR (K-DUR) 20 mEq tablet Take 20 mEq by mouth as Needed (Take 1 tablet if taking Lasix). Take with a meal and a full glass of water.   ? senna/docusate (SENOKOT-S) 8.6/50 mg tablet Take two tablets by mouth twice daily.     Total time spent on today's office visit was 30 minutes.  This includes face-to-face in person visit with patient as well as nonface-to-face time including review of the EMR, outside records, labs, radiologic studies, echocardiogram & other cardiovascular studies, formation of treatment plan, after visit summary, future disposition, and lastly on documentation.

## 2020-01-19 NOTE — Assessment & Plan Note
His blood pressure looks fine today.

## 2020-01-21 ENCOUNTER — Encounter: Admit: 2020-01-21 | Discharge: 2020-01-21 | Payer: MEDICARE

## 2020-04-21 ENCOUNTER — Encounter: Admit: 2020-04-21 | Discharge: 2020-04-21 | Payer: MEDICARE

## 2020-04-21 DIAGNOSIS — Z950 Presence of cardiac pacemaker: Secondary | ICD-10-CM

## 2020-06-30 ENCOUNTER — Ambulatory Visit: Admit: 2020-06-30 | Discharge: 2020-06-30 | Payer: MEDICARE

## 2020-06-30 ENCOUNTER — Encounter: Admit: 2020-06-30 | Discharge: 2020-06-30 | Payer: MEDICARE

## 2020-06-30 DIAGNOSIS — I5032 Chronic diastolic (congestive) heart failure: Secondary | ICD-10-CM

## 2020-06-30 DIAGNOSIS — Z950 Presence of cardiac pacemaker: Secondary | ICD-10-CM

## 2020-07-22 ENCOUNTER — Encounter: Admit: 2020-07-22 | Discharge: 2020-07-22 | Payer: MEDICARE

## 2020-10-23 IMAGING — CT ABDOMEN_PELVIS W(Adult)
2 of 3 series · 13 of 46 positions shown, 15 images · IV contrast (Omnipaque)
Comparison: none

[Series 2: abdomen ax 3.00 br40 s3 · axial · 0.55mm/px · z∈[+1138,+1471]mm · 10 of 129 slices shown, 12 images]
[im 9/129  soft-tissue]
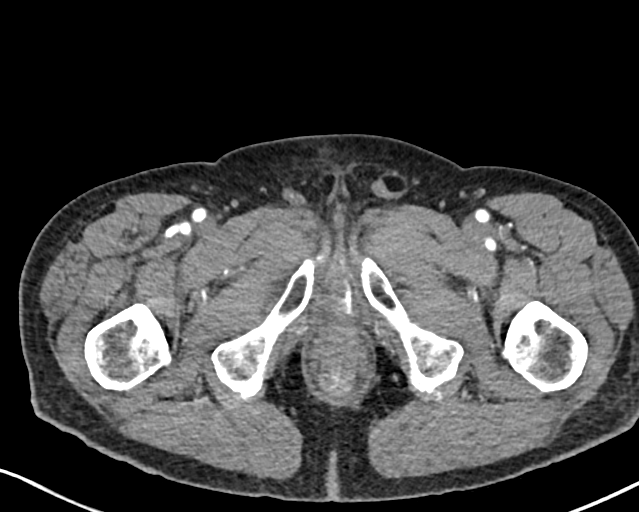
[im 9/129  bone]
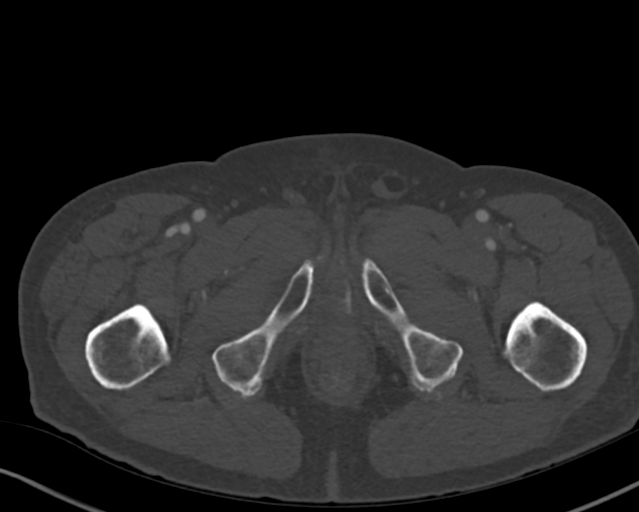
[im 21/129  soft-tissue]
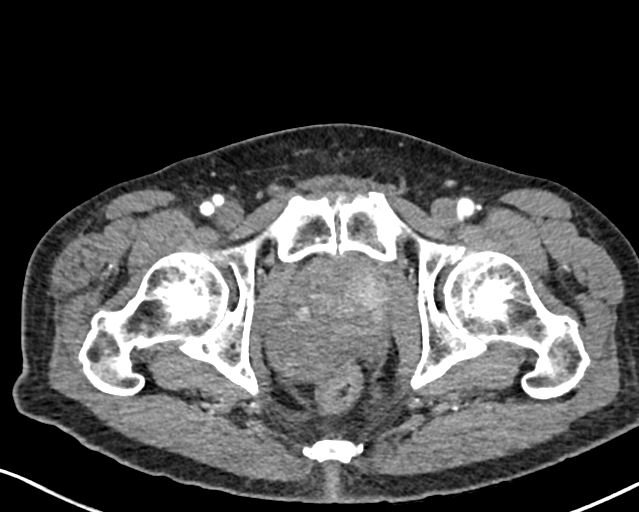
[im 34/129  soft-tissue]
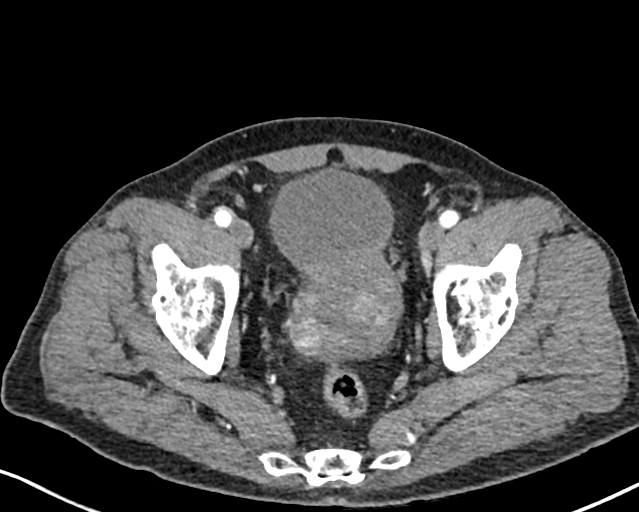
[im 46/129  soft-tissue]
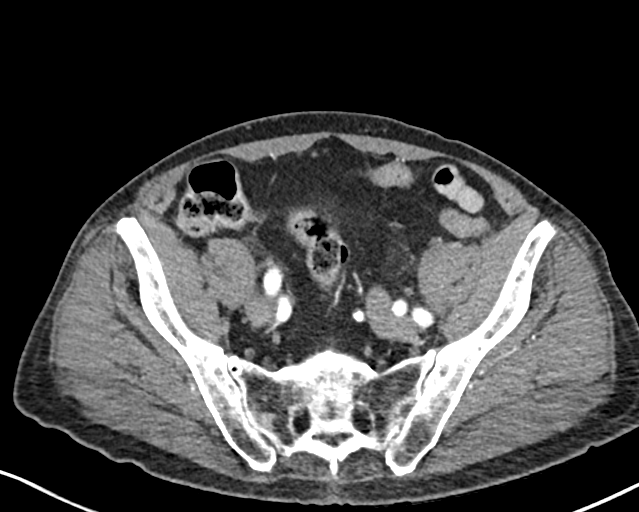
[im 58/129  soft-tissue]
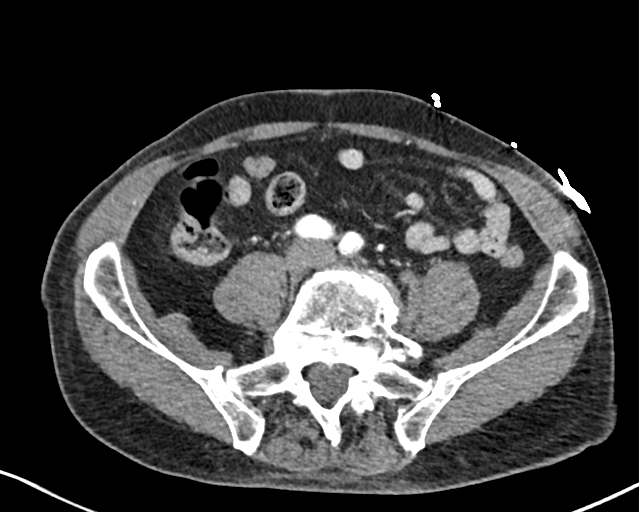
[im 71/129  soft-tissue]
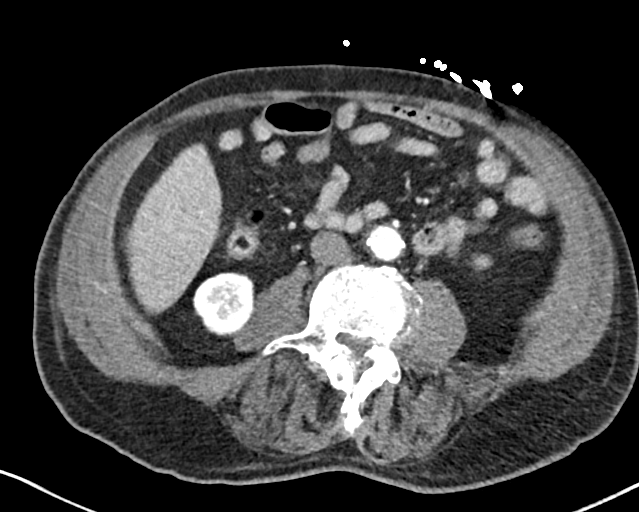
[im 83/129  soft-tissue]
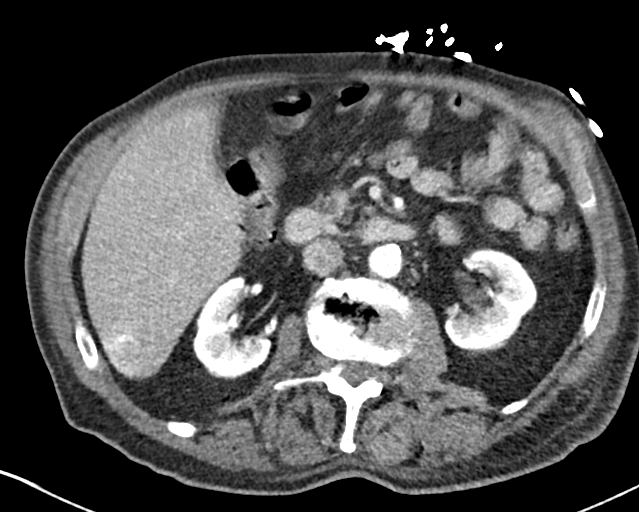
[im 95/129  soft-tissue]
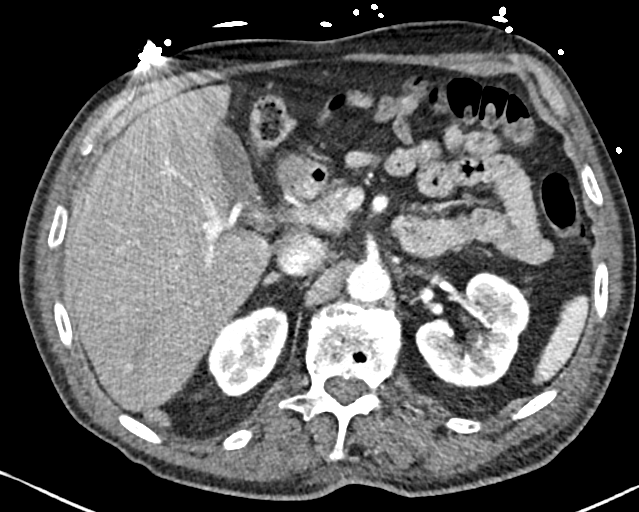
[im 108/129  soft-tissue]
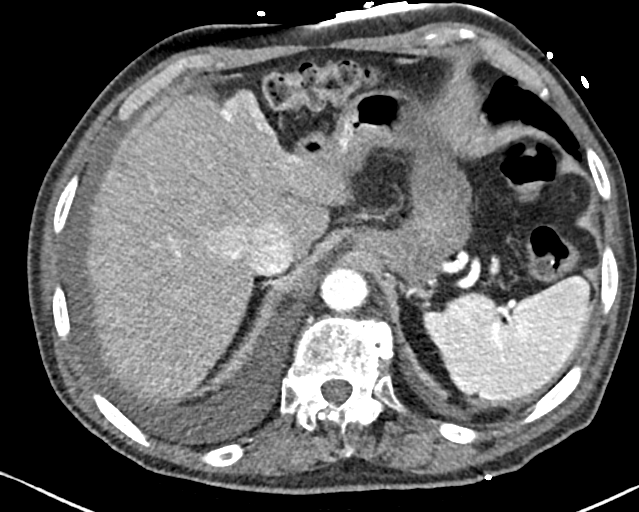
[im 108/129  bone]
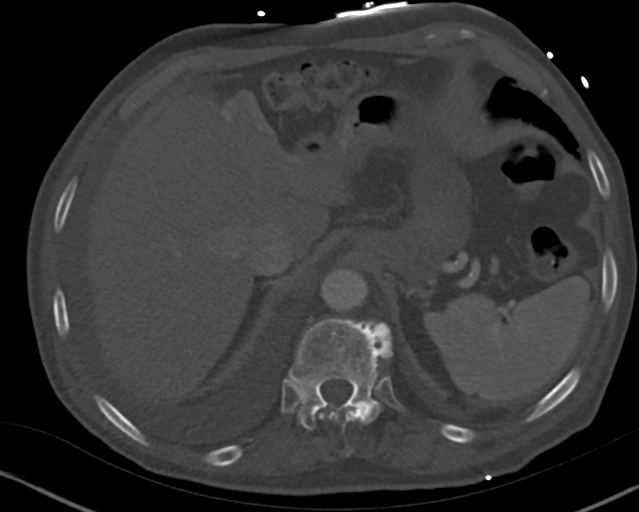
[im 120/129  soft-tissue]
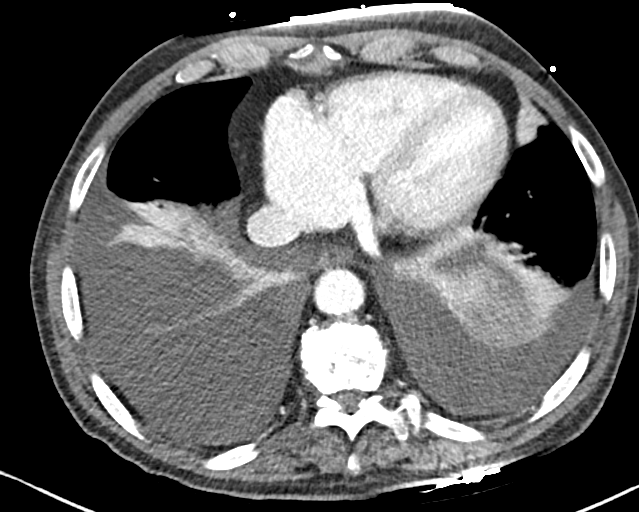

[Series 4: abdomen cor 3.00 br40 s3 · coronal · 0.68mm/px · 3 of 93 slices shown]
[im 31/93  soft-tissue]
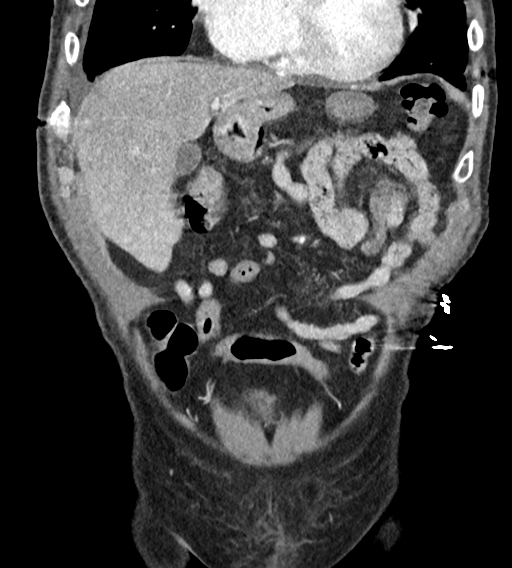
[im 41/93  soft-tissue]
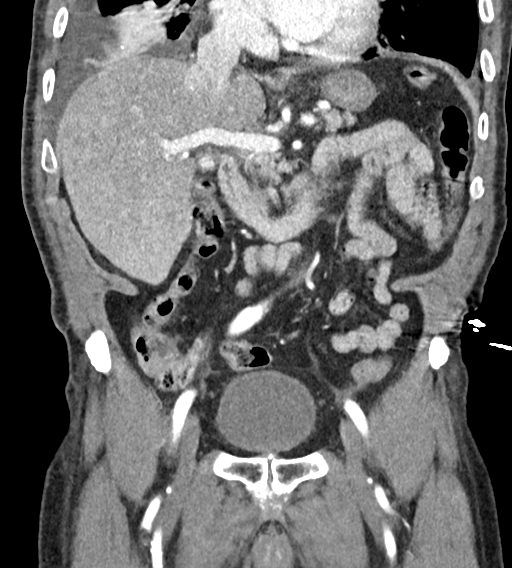
[im 52/93  soft-tissue]
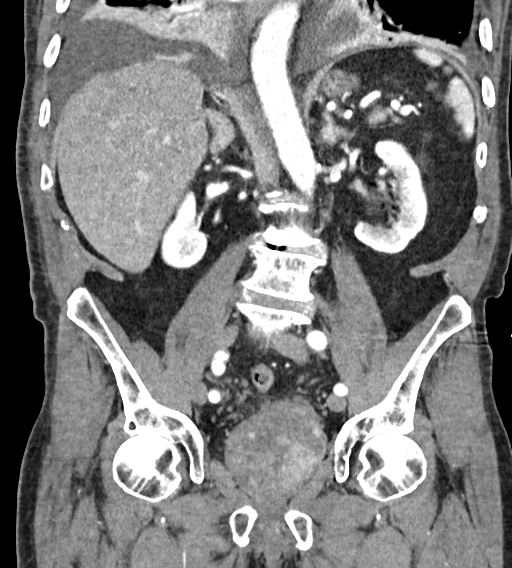

[13 of 46 positions shown; findings below may reference images not displayed]

01/04/18

EXAM
CT abdomen and pelvis.

INDICATION
abd pain
LOWER ABDOMINAL PAIN. WHEEZING. GIVEN 100 ML OMNI 300 100 ML. GFR 61.9 [id number]

TECHNIQUE
Following the uneventful administration of 100 cc Omnipaque 300 low osmolar intravenous contrast,
routine CT scan of the abdomen and pelvis was performed. All CT scans at this facility use dose
modulation, iterative reconstruction, and/or weight based dosing when appropriate to reduce
radiation dose to as low as reasonably achievable.

COMPARISONS
None

FINDINGS
Moderate to large amount of layering pleural fluid bilaterally, right greater than left. Associated
compressive atelectasis. The cardiac size is not enlarged. There is small amount of pericardial
fluid.
There is a peripherally enhancing mass in segment 6 of the liver which measures 2.4 x
centimeters in dimension. This is incompletely characterized without multi phase liver CT or MRI but
favor hemangioma. No other hepatic mass.
Cholelithiasis without evidence of cholecystitis.
Normal appearance of the main portal vein.
The spleen is within normal limits.
No adrenal mass, though adrenal hyperplasia is noted.
The pancreas shows no ductal dilatation or mass. No adjacent inflammation.
Renal cortical atrophy is relatively mild. There is no hydronephrosis or perinephric stranding.
There is no small bowel obstruction. No ascites or pneumoperitoneum. No pericecal inflammation.
The prostate gland is very heterogeneous and lobular morphology. It is markedly enlarged measuring
up to 9.3 x 7.4 centimeters in dimension and demonstrates some mass effect on the posterior urinary
bladder. The urinary bladder wall is not significantly thickened but there may be a small amount of
adjacent edema around the urinary bladder.
There is aortic atherosclerosis without aneurysmal dilatation or dissection.
No pathologic enlargement of the retroperitoneal or mesenteric lymph nodes. Hip and sacroiliac
joint degeneration. Lumbar spondylosis and facet arthropathy. S-shaped curvature of the
thoracolumbar spine. Grade 2 anterolisthesis of L4 on L5 with bilateral pars interarticularis
defects.

IMPRESSION
Moderate to large pleural effusions with associated compressive atelectasis. Probable hepatic
hemangioma in the right lower lobe with extensive peripheral puddling/rim enhancement. Multi phase
hepatic CT or MRI should be considered for more definitive characterization.

Tech Notes:

LOWER ABDOMINAL PAIN. WHEEZING. GIVEN 100 ML OMNI 300 100 ML. GFR 61.9 [id number]

## 2020-10-25 ENCOUNTER — Encounter: Admit: 2020-10-25 | Discharge: 2020-10-25 | Payer: MEDICARE

## 2021-01-19 ENCOUNTER — Encounter: Admit: 2021-01-19 | Discharge: 2021-01-19 | Payer: MEDICARE

## 2021-02-24 ENCOUNTER — Encounter: Admit: 2021-02-24 | Discharge: 2021-02-24 | Payer: MEDICARE

## 2021-02-28 ENCOUNTER — Encounter: Admit: 2021-02-28 | Discharge: 2021-02-28 | Payer: MEDICARE

## 2021-02-28 DIAGNOSIS — I10 Essential (primary) hypertension: Secondary | ICD-10-CM

## 2021-02-28 DIAGNOSIS — R001 Bradycardia, unspecified: Secondary | ICD-10-CM

## 2021-02-28 DIAGNOSIS — I480 Paroxysmal atrial fibrillation: Secondary | ICD-10-CM

## 2021-02-28 DIAGNOSIS — Z950 Presence of cardiac pacemaker: Secondary | ICD-10-CM

## 2021-02-28 DIAGNOSIS — I5032 Chronic diastolic (congestive) heart failure: Secondary | ICD-10-CM

## 2021-02-28 DIAGNOSIS — I4891 Unspecified atrial fibrillation: Secondary | ICD-10-CM

## 2021-02-28 DIAGNOSIS — N400 Enlarged prostate without lower urinary tract symptoms: Secondary | ICD-10-CM

## 2021-02-28 NOTE — Assessment & Plan Note
No more atrial fibrillation detected on his recent pacemaker check.

## 2021-02-28 NOTE — Assessment & Plan Note
He had a remote transmission in January showing normal device function.  He paces his atria nearly 100% of the time and RV pacing is infrequent.  He has a few brief episodes of SVT that seem to be asymptomatic.

## 2021-02-28 NOTE — Assessment & Plan Note
His weight seems stable and he denies any problems with fluid retention or breathlessness.

## 2021-02-28 NOTE — Assessment & Plan Note
His family does not check his blood pressure at home.  I think a goal of about 150/80 is reasonable for him.

## 2021-02-28 NOTE — Progress Notes
Date of Service: 02/28/2021    Edward Santana is a 88 y.o. male.       HPI     Edward Santana was in the [place] clinic today with his granddaughter.  He still lives in his own home but family lives on the property.  He prepares his own breakfast but his family brings his other meals.    As always, he denies any symptoms such as chest discomfort, breathlessness, or lightheadedness.  He has not had any palpitations.  The family has not noticed any sort of TIA or stroke symptoms.         Vitals:    02/28/21 1519   BP: (!) 154/86   BP Source: Arm, Left Upper   Pulse: 65   SpO2: 93%   O2 Device: None (Room air)   PainSc: Zero   Weight: 57.2 kg (126 lb 3.2 oz)   Height: 152.4 cm (5')     Body mass index is 24.65 kg/m?Marland Kitchen     Past Medical History  Patient Active Problem List    Diagnosis Date Noted   ? Cardiac pacemaker 02/04/2018     02/10/2018: Dual chamber pacemaker placement under moderate sedation (left chest St Jude) by Dr Bernette Mayers     ? Screening for cardiovascular condition 01/28/2018     08/2016 - MPI @ [facility]  EF 63%.  No ischemia.     ? Bradycardia 01/04/2018     02/10/2018: Dual chamber pacemaker placement under moderate sedation (left chest St Jude) by Dr Bernette Mayers     ? Atrial fibrillation (HCC) 01/04/2018     08/2016 - EKG @ [facility] showed AF, rate 90  01/2017 - AF noted during [facility] OV.  A/C refused.  07/2017 - AF during office visit at [facility], patient refused A/C     ? Chronic diastolic heart failure (HCC) 01/04/2018     08/2016 - Echo @ [facility]:  EF 55%.  Mild MR.  PAP 26.  LA size normal     ? HTN (hypertension) 01/04/2018   ? Pre-diabetes 01/04/2018   ? Iron deficiency anemia 01/04/2018         Review of Systems   Constitutional: Negative.   HENT: Negative.    Eyes: Negative.    Cardiovascular: Negative.    Respiratory: Negative.    Endocrine: Negative.    Hematologic/Lymphatic: Negative.    Skin: Negative.    Musculoskeletal: Negative.    Gastrointestinal: Negative.    Genitourinary: Negative.    Neurological: Negative.    Psychiatric/Behavioral: Negative.    Allergic/Immunologic: Negative.        Physical Exam    Physical Exam   General Appearance: no distress   Skin: warm, no ulcers or xanthomas   Digits and Nails: no cyanosis or clubbing   Eyes: conjunctivae and lids normal, pupils are equal and round   Teeth/Gums/Palate: dentition unremarkable, no lesions   Lips & Oral Mucosa: no pallor or cyanosis   Neck Veins: normal JVP , neck veins are not distended   Thyroid: no nodules, masses, tenderness or enlargement   Chest Inspection: chest is normal in appearance   Respiratory Effort: breathing comfortably, no respiratory distress   Auscultation/Percussion: lungs clear to auscultation, no rales or rhonchi, no wheezing   PMI: PMI not enlarged or displaced   Cardiac Rhythm: regular rhythm and normal rate   Cardiac Auscultation: S1, S2 normal, no rub, no gallop   Murmurs: no murmur   Peripheral  Circulation: normal peripheral circulation   Carotid Arteries: normal carotid upstroke bilaterally, no bruits   Radial Arteries: normal symmetric radial pulses   Abdominal Aorta: no abdominal aortic bruit   Pedal Pulses: normal symmetric pedal pulses   Lower Extremity Edema: no lower extremity edema   Abdominal Exam: soft, non-tender, no masses, bowel sounds normal   Liver & Spleen: no organomegaly   Gait & Station: walks without assistance   Muscle Strength: normal muscle tone   Orientation: oriented to time, place and person   Affect & Mood: appropriate and sustained affect   Language and Memory: patient responsive and seems to comprehend information   Neurologic Exam: neurological assessment grossly intact   Other: moves all extremities      Cardiovascular Health Factors  Vitals BP Readings from Last 3 Encounters:   02/28/21 (!) 154/86   01/19/20 132/72   11/18/18 132/84     Wt Readings from Last 3 Encounters:   02/28/21 57.2 kg (126 lb 3.2 oz)   01/19/20 59.1 kg (130 lb 6.4 oz) 11/18/18 59.8 kg (131 lb 12.8 oz)     BMI Readings from Last 3 Encounters:   02/28/21 24.65 kg/m?   01/19/20 23.85 kg/m?   11/18/18 20.04 kg/m?      Smoking Social History     Tobacco Use   Smoking Status Never   Smokeless Tobacco Never      Lipid Profile Cholesterol   Date Value Ref Range Status   01/06/2020 178  Final     HDL   Date Value Ref Range Status   01/06/2020 47  Final     LDL   Date Value Ref Range Status   01/06/2020 115 (H) <100 Final     Triglycerides   Date Value Ref Range Status   01/06/2020 80  Final      Blood Sugar Hemoglobin A1C   Date Value Ref Range Status   01/04/2018 6.0 4.0 - 6.0 % Final     Comment:     The ADA recommends that most patients with type 1 and type 2 diabetes maintain   an A1c level <7%.       Glucose   Date Value Ref Range Status   01/26/2021 110 (H) 70 - 105 Final   01/06/2020 104  Final   02/11/2018 85 70 - 100 MG/DL Final          Problems Addressed Today  Encounter Diagnoses   Name Primary?   ? Chronic diastolic heart failure (HCC)    ? Paroxysmal atrial fibrillation (HCC)    ? Bradycardia    ? Cardiac pacemaker    ? Primary hypertension        Assessment and Plan       Cardiac pacemaker  He had a remote transmission in January showing normal device function.  He paces his atria nearly 100% of the time and RV pacing is infrequent.  He has a few brief episodes of SVT that seem to be asymptomatic.    HTN (hypertension)  His family does not check his blood pressure at home.  I think a goal of about 150/80 is reasonable for him.    Chronic diastolic heart failure (HCC)  His weight seems stable and he denies any problems with fluid retention or breathlessness.    Atrial fibrillation (HCC)  No more atrial fibrillation detected on his recent pacemaker check.      Current Medications (including today's revisions)  ? acetaminophen SR (TYLENOL) 650  mg tablet Take one tablet by mouth daily.   ? amLODIPine (NORVASC) 5 mg tablet Take one tablet by mouth daily.   ? aspirin EC 81 mg tablet Take one tablet by mouth daily. Take with food.   ? CALCIUM PO Take 1 tablet by mouth daily.   ? CHOLEcalciferoL (vitamin D3) (VITAMIN D3) 5000 unit tablet Take one tablet by mouth every 7 days.   ? diclofenac (VOLTAREN) 1 % topical gel Apply  topically to affected area twice daily.   ? ferrous sulfate (FEOSOL) 325 mg (65 mg iron) tablet Take one tablet by mouth twice daily. Take on an empty stomach at least 1 hour before or 2 hours after food.   ? furosemide (LASIX) 40 mg tablet Take one tablet by mouth as Needed. For weight gain of 2 lbs in one day or 5 pounds over 3 days   ? MULTIVITAMIN PO Take 1 tablet by mouth daily.   ? polyethylene glycol 3350 (MIRALAX) 17 g packet Take two packets by mouth twice daily. (Patient taking differently: Take one packet by mouth daily.)   ? potassium chloride SR (K-DUR) 20 mEq tablet Take one tablet by mouth as Needed (Take 1 tablet if taking Lasix). Take with a meal and a full glass of water.   ? senna/docusate (SENOKOT-S) 8.6/50 mg tablet Take two tablets by mouth twice daily.     Total time spent on today's office visit was 20 minutes.  This includes face-to-face in person visit with patient as well as nonface-to-face time including review of the EMR, outside records, labs, radiologic studies, echocardiogram & other cardiovascular studies, formation of treatment plan, after visit summary, future disposition, and lastly on documentation.

## 2021-04-20 ENCOUNTER — Encounter: Admit: 2021-04-20 | Discharge: 2021-04-20 | Payer: MEDICARE

## 2021-04-21 ENCOUNTER — Encounter: Admit: 2021-04-21 | Discharge: 2021-04-21 | Payer: MEDICARE

## 2021-04-21 NOTE — Telephone Encounter
-----   Message from Connye Burkitt, RN sent at 04/20/2021  2:25 PM CDT -----  Regarding: FW: SDO NSVT episodes and AFl with no OAC    ----- Message -----  From: Thayer Headings, RN  Sent: 04/20/2021   2:06 PM CDT  To: Connye Burkitt, RN  Subject: FW: SDO NSVT episodes and AFl with no OAC          ----- Message -----  From: Marisa Sprinkles, RN  Sent: 04/20/2021   1:59 PM CDT  To: Cvm Nurse Gen Card Team Red  Subject: SDO NSVT episodes and AFl with no OAC            We received a remote on 4/11 which has 2 episodes that are possible NSVT. NSVT episodes occurred 4/2 @ 01:03 lasting 7sec w/ Avg V. rate 124 bpm and 11:00 lasting 3sec w/ Avg V. rate 140 bpm. The presenting rhythm shows ongoing AFl. I don't see an OAC on the medication list. I do see that is was refused in the past.     Thanks,   Brittney/device team

## 2021-04-21 NOTE — Telephone Encounter
LMOM called Melissa dtr in-law discussed with her.  She states no know symptoms.  Patient has been mildly hypertensive at recent doctor' s appts.  He is high risk for falling and therefore might not be a good anticoagulant candidate.  She will discuss with family and get back with [place] if it is a consideration.

## 2021-06-22 ENCOUNTER — Ambulatory Visit: Admit: 2021-06-22 | Discharge: 2021-06-22 | Payer: MEDICARE

## 2021-06-22 ENCOUNTER — Encounter: Admit: 2021-06-22 | Discharge: 2021-06-22 | Payer: MEDICARE

## 2021-06-22 DIAGNOSIS — I5032 Chronic diastolic (congestive) heart failure: Secondary | ICD-10-CM

## 2021-07-04 ENCOUNTER — Encounter: Admit: 2021-07-04 | Discharge: 2021-07-04 | Payer: MEDICARE

## 2021-07-04 NOTE — Telephone Encounter
Called and discussed with patient's daughter.  She states that Edward Santana has been feeling good and has not had any complaints.  She has not noticed any fatigue or shortness of breath or any other issues.    Will route to Dr. Barry Dienes for recommendations.

## 2021-07-04 NOTE — Telephone Encounter
-----   Message from Fransico Michael, RN sent at 07/04/2021  1:14 PM CDT -----  Regarding: FW: SDO  Alert on remote    ----- Message -----  From: Rich Fuchs, RN  Sent: 07/04/2021   9:57 AM CDT  To: Connye Burkitt, RN  Subject: FW: SDO  Alert on remote                           ----- Message -----  From: Reino Kent, RN  Sent: 07/04/2021   9:42 AM CDT  To: Cvm Nurse Gen Card Team Red  Subject: SDO  Alert on remote                             Hello,    We received a alert on the pt's remote for high AT/AF burden and a increase in RV pacing.  Previously in April his RV pacing was 1.6% and it has increased to 56%.  Per RV pacing trends he has an increase in RV pacing from ~04/09/21-05/11/21.  Please follow up as needed.

## 2021-07-14 ENCOUNTER — Encounter: Admit: 2021-07-14 | Discharge: 2021-07-14 | Payer: MEDICARE

## 2021-07-14 DIAGNOSIS — Z950 Presence of cardiac pacemaker: Secondary | ICD-10-CM

## 2021-07-21 ENCOUNTER — Encounter: Admit: 2021-07-21 | Discharge: 2021-07-21 | Payer: MEDICARE

## 2021-07-28 ENCOUNTER — Encounter: Admit: 2021-07-28 | Discharge: 2021-07-28 | Payer: MEDICARE

## 2021-07-28 NOTE — Telephone Encounter
-----   Message from Arlyce Dice, RN sent at 07/21/2021 10:53 AM CDT -----  Regarding: FW: Ongoing AF, high VP%    ----- Message -----  From: Truddie Coco, RN  Sent: 07/21/2021  10:41 AM CDT  To: Cvm Nurse Gen Card Team Red  Subject: Ongoing AF, high VP%                             Good morning:    We received a remote on pt with the following:     Presenting rhythm reviewed: AF-VP 70's bpm  VP 56%- significantly increased since being in AF  100% AF burden since at least 06/22/21, V rates are > 90 bpm ~ 30% of the time  No OAC    Atrial lead signal amplitude suboptimal  Stability of the signal over time needs to be monitored  Measured value: 0.48mV- at last OV when pt was in SR. Auto sense not on but p waves are likely much smaller in AF now.  Programmed sensitivity setting: 0.42mV  Sensitivity reprogrammed: recommended at next OV    Please see full report in Epic for details and follow up as needed.    Thanks- Soil scientist / Device Team

## 2021-07-28 NOTE — Telephone Encounter
Called and spoke to patient's daughter in law, Marcelino Duster. She states that patient is doing okay. He has no complaints at this time. Will continue to monitor.

## 2021-10-19 ENCOUNTER — Encounter: Admit: 2021-10-19 | Discharge: 2021-10-19 | Payer: MEDICARE

## 2021-10-19 NOTE — Telephone Encounter
Called and spoke to patient's daughter in law Soso. She states that patient is completely asymptomatic and has no new complaints at this time. Patient is scheduled for annual follow up in March.

## 2021-10-19 NOTE — Telephone Encounter
-----   Message from Nichola Sizer, RN sent at 10/19/2021 11:12 AM CDT -----  Regarding: FW: SDO AFL w/ high V rates    ----- Message -----  From: Tana Conch  Sent: 10/19/2021  11:11 AM CDT  To: Cvm Nurse Gen Card Team Red  Subject: SDO AFL w/ high V rates                          Remote from 10/17/21 presents with AFL with high V rates at times.  Stored EGMs are consistent with or suggestive of Atrial Flutter  AT Burden: 53%  Total number of events: 12  Most recent episode on 09/16/21 @ 8:11am  Longest episode on 09/16/21 beginning @ 6:52am, duration 57m 56s.  V rates controlled per rate histograms however, there are occasional RVR episodes declared consecutive PVC episodes by device. Atrial undersening is occurring at times. .  Not on [place]. Patient refuses.  High ventricular rate event(s) detected within programmed monitor zone  Total episodes: 3.  Most recent/longest on 09/25/21 @ 6:33am  Longest on 09/07/21 @ 8:18pm, duration 6s.  Two episodes were declared consecutive PVCs. This episodes appear to be AFL w/ RVR vs. NSVT.  V rates 167-222 bpm  Complete report sent to chart for further detail/review.  Follow up as needed.     Thank you,  Terri/Device Team

## 2022-01-18 ENCOUNTER — Encounter: Admit: 2022-01-18 | Discharge: 2022-01-18 | Payer: MEDICARE

## 2022-01-18 NOTE — Telephone Encounter
Called and spoke to pt's daughter.  She states that he has not had any complaints, but doesn't believe he would mention if he did.    Reviewed with Dr. Ricard Dillon in clinic by Charisse Klinefelter. RN, no changes at this time, continue to monitor.

## 2022-01-18 NOTE — Telephone Encounter
-----  Message from Vernell Morgans, RN sent at 01/18/2022 11:11 AM CST -----  Regarding: FW: SDO "Consecutive PVC episodes"    ----- Message -----  From: Tana Conch  Sent: 01/18/2022  11:06 AM CST  To: Cvm Nurse Gen Card Team Red  Subject: SDO "Consecutive PVC episodes"                   Please review consecutive PVC episodes noted on 01/16/22 remote transmission completed today.  There are 10 episodes declared as consecutive PVC episodes by the device. These episodes appear to show NSVT/juncitonal rhythm, and possible atrial undersensing.  Most recent on 01/11/22 @ 9:45am.  Pt also had a 61m 42s AT/AFL episode on 12/23/21 beginning @ 4:37am.  Complete report sent to chart for further detail/review.  Follow up as needed.     Thank you,  Terri/Device Team

## 2022-03-07 ENCOUNTER — Encounter: Admit: 2022-03-07 | Discharge: 2022-03-07 | Payer: MEDICARE

## 2022-03-07 NOTE — Progress Notes
Records Request    Medical records request for continuation of care:    Edward Santana, Edward Santana   DOB: October 08, 1924  SSN:  [id number]      Patient has appointment on 03/20/2022   with  Dr. Tyson Alias* .      **Please fax records to [facility] [place]      Request records: STAT                      Recent Labs (CMP, Lipid Panel)                          Thank you,      Cardiovascular Medicine  [facility]  [address] [place] [address]  Phone:  [phone]  Fax:  [phone]

## 2022-03-20 ENCOUNTER — Encounter: Admit: 2022-03-20 | Discharge: 2022-03-20 | Payer: MEDICARE

## 2022-03-20 DIAGNOSIS — D539 Nutritional anemia, unspecified: Secondary | ICD-10-CM

## 2022-03-20 DIAGNOSIS — I4811 Longstanding persistent atrial fibrillation: Secondary | ICD-10-CM

## 2022-03-20 DIAGNOSIS — I499 Cardiac arrhythmia, unspecified: Secondary | ICD-10-CM

## 2022-03-20 DIAGNOSIS — I10 Essential (primary) hypertension: Secondary | ICD-10-CM

## 2022-03-20 DIAGNOSIS — R001 Bradycardia, unspecified: Secondary | ICD-10-CM

## 2022-03-20 DIAGNOSIS — M1990 Unspecified osteoarthritis, unspecified site: Secondary | ICD-10-CM

## 2022-03-20 DIAGNOSIS — N400 Enlarged prostate without lower urinary tract symptoms: Secondary | ICD-10-CM

## 2022-03-20 DIAGNOSIS — Z950 Presence of cardiac pacemaker: Secondary | ICD-10-CM

## 2022-03-20 DIAGNOSIS — I4891 Unspecified atrial fibrillation: Secondary | ICD-10-CM

## 2022-03-20 DIAGNOSIS — I5032 Chronic diastolic (congestive) heart failure: Secondary | ICD-10-CM

## 2022-03-20 NOTE — Assessment & Plan Note
BP seems fine--goal for him would be an average of around 140/80 or less.

## 2022-03-20 NOTE — Progress Notes
Date of Service: 03/20/2022    Edward Santana is a 89 y.o. male.       HPI     Edward Santana was in the [place] clinic today with one of his daughters.  He still lives in his own home.  His son and daughter-in-law live next-door and they help with his meals and general care.    He seems about the same as he was a year ago.  He denies any symptoms such as lightheadedness or syncope.  He has had no TIA or stroke symptoms.  He has had no falls over the last year.  He denies any chest discomfort.         Vitals:    03/20/22 0920   BP: (!) 146/78   BP Source: Arm, Left Upper   Pulse: 60   SpO2: 97%   O2 Device: None (Room air)   PainSc: Zero   Weight: 57.5 kg (126 lb 12.8 oz)   Height: 157.5 cm (5' 2)     Body mass index is 23.19 kg/m?Marland Kitchen     Past Medical History  Patient Active Problem List    Diagnosis Date Noted    Cardiac pacemaker 02/04/2018     02/10/2018: Dual chamber pacemaker placement under moderate sedation (left chest St Jude) by Dr Bernette Mayers      Screening for cardiovascular condition 01/28/2018     08/2016 - MPI @ [facility]:  EF 63%.  No ischemia.      Bradycardia 01/04/2018     02/10/2018: Dual chamber pacemaker placement under moderate sedation (left chest St Jude) by Dr Bernette Mayers      Atrial fibrillation [facility]) 01/04/2018     08/2016 - EKG @ [facility] showed AF, rate 90  01/2017 - AF noted during [facility] OV.  A/C refused.  07/2017 - AF during office visit at [facility], patient refused A/C      Chronic diastolic heart failure [facility]) 01/04/2018     08/2016 - Echo @ [facility]:  EF 55%.  Mild MR.  PAP 26.  LA size normal      HTN (hypertension) 01/04/2018    Pre-diabetes 01/04/2018    Iron deficiency anemia 01/04/2018         Review of Systems   Constitutional: Negative.   HENT: Negative.     Eyes: Negative.    Cardiovascular: Negative.    Respiratory: Negative.     Endocrine: Negative.    Hematologic/Lymphatic: Negative.    Skin: Negative.    Musculoskeletal:  Positive for arthritis and joint pain.   Gastrointestinal: Negative.    Genitourinary: Negative.    Neurological: Negative.    Psychiatric/Behavioral: Negative.     Allergic/Immunologic: Negative.        Physical Exam    General Appearance: no distress   Skin: warm, no ulcers or xanthomas   Digits and Nails: no cyanosis or clubbing   Eyes: conjunctivae and lids normal, pupils are equal and round   Teeth/Gums/Palate: dentition unremarkable, no lesions   Lips & Oral Mucosa: no pallor or cyanosis   Neck Veins: normal JVP , neck veins are not distended   Thyroid: no nodules, masses, tenderness or enlargement   Chest Inspection: chest is normal in appearance   Respiratory Effort: breathing comfortably, no respiratory distress   Auscultation/Percussion: lungs clear to auscultation, no rales or rhonchi, no wheezing   PMI: PMI not enlarged or displaced   Cardiac Rhythm: regular rhythm and normal rate  Cardiac Auscultation: S1, S2 normal, no rub, no gallop   Murmurs: no murmur   Peripheral Circulation: normal peripheral circulation   Carotid Arteries: normal carotid upstroke bilaterally, no bruits   Radial Arteries: normal symmetric radial pulses   Abdominal Aorta: no abdominal aortic bruit   Pedal Pulses: normal symmetric pedal pulses   Lower Extremity Edema: no lower extremity edema   Abdominal Exam: soft, non-tender, no masses, bowel sounds normal   Liver & Spleen: no organomegaly   Gait & Station: walks without assistance   Muscle Strength: normal muscle tone   Orientation: oriented to time, place and person   Affect & Mood: appropriate and sustained affect   Language and Memory: patient responsive and seems to comprehend information   Neurologic Exam: neurological assessment grossly intact   Other: moves all extremities        Cardiovascular Health Factors  Vitals BP Readings from Last 3 Encounters:   03/20/22 (!) 146/78   02/28/21 (!) 154/86   01/19/20 132/72     Wt Readings from Last 3 Encounters:   03/20/22 57.5 kg (126 lb 12.8 oz)   02/28/21 57.2 kg (126 lb 3.2 oz)   01/19/20 59.1 kg (130 lb 6.4 oz)     BMI Readings from Last 3 Encounters:   03/20/22 23.19 kg/m?   02/28/21 24.65 kg/m?   01/19/20 23.85 kg/m?      Smoking Social History     Tobacco Use   Smoking Status Never   Smokeless Tobacco Never      Lipid Profile Cholesterol   Date Value Ref Range Status   01/06/2020 178  Final     HDL   Date Value Ref Range Status   01/06/2020 47  Final     LDL   Date Value Ref Range Status   01/06/2020 115 (H) <100 Final     Triglycerides   Date Value Ref Range Status   01/06/2020 80  Final      Blood Sugar Hemoglobin A1C   Date Value Ref Range Status   01/04/2018 6.0 4.0 - 6.0 % Final     Comment:     The ADA recommends that most patients with type 1 and type 2 diabetes maintain   an A1c level <7%.       Glucose   Date Value Ref Range Status   01/26/2021 110 (H) 70 - 105 Final   01/06/2020 104  Final   02/11/2018 85 70 - 100 MG/DL Final          Problems Addressed Today  Encounter Diagnoses   Name Primary?    Primary hypertension Yes    Chronic diastolic heart failure (HCC)     Longstanding persistent atrial fibrillation [facility])     Cardiac pacemaker        Assessment and Plan       Atrial fibrillation (HCC)  His device check shows essentially no AF.  He's pacing his atria nearly 100% of the time.  He's not on anti-coagulation due to fall risk.  The family and the patient have indicated no interest in a LAA occlusion device.    HTN (hypertension)  BP seems fine--goal for him would be an average of around 140/80 or less.    Cardiac pacemaker  Pacer check in January shows a little under 6 years estimated battery longevity.  He paces his atrial nearly all the time and has some episodes of atrial tachycardia.      Current Medications (including today's  revisions)   acetaminophen SR (TYLENOL) 650 mg tablet Take one tablet by mouth daily.    amLODIPine (NORVASC) 5 mg tablet Take one tablet by mouth daily.    aspirin EC 81 mg tablet Take one tablet by mouth daily. Take with food.    CALCIUM PO Take 1 tablet by mouth daily.    CHOLEcalciferoL (vitamin D3) (VITAMIN D3) 5000 unit tablet Take one tablet by mouth every 7 days.    diclofenac (VOLTAREN) 1 % topical gel Apply  topically to affected area twice daily.    ferrous sulfate (FEOSOL) 325 mg (65 mg iron) tablet Take one tablet by mouth twice daily. Take on an empty stomach at least 1 hour before or 2 hours after food.    MULTIVITAMIN PO Take 1 tablet by mouth daily.    polyethylene glycol 3350 (MIRALAX) 17 g packet Take two packets by mouth twice daily. (Patient taking differently: Take one packet by mouth daily.)    senna/docusate (SENOKOT-S) 8.6/50 mg tablet Take two tablets by mouth twice daily.     Total time spent on today's office visit was 30 minutes.  This includes face-to-face in person visit with patient as well as nonface-to-face time including review of the EMR, outside records, labs, radiologic studies, echocardiogram & other cardiovascular studies, formation of treatment plan, after visit summary, future disposition, and lastly on documentation.

## 2022-03-20 NOTE — Assessment & Plan Note
Pacer check in January shows a little under 6 years estimated battery longevity.  He paces his atrial nearly all the time and has some episodes of atrial tachycardia.

## 2022-04-19 ENCOUNTER — Encounter: Admit: 2022-04-19 | Discharge: 2022-04-19 | Payer: MEDICARE

## 2022-07-05 ENCOUNTER — Encounter: Admit: 2022-07-05 | Discharge: 2022-07-05 | Payer: MEDICARE

## 2022-07-05 ENCOUNTER — Ambulatory Visit: Admit: 2022-07-05 | Discharge: 2022-07-05 | Payer: MEDICARE

## 2022-07-05 DIAGNOSIS — Z950 Presence of cardiac pacemaker: Secondary | ICD-10-CM

## 2022-07-09 ENCOUNTER — Encounter: Admit: 2022-07-09 | Discharge: 2022-07-09 | Payer: MEDICARE

## 2022-07-09 DIAGNOSIS — I5032 Chronic diastolic (congestive) heart failure: Secondary | ICD-10-CM

## 2022-07-09 DIAGNOSIS — I480 Paroxysmal atrial fibrillation: Secondary | ICD-10-CM

## 2022-07-09 DIAGNOSIS — Z950 Presence of cardiac pacemaker: Secondary | ICD-10-CM

## 2022-07-23 ENCOUNTER — Encounter: Admit: 2022-07-23 | Discharge: 2022-07-22 | Payer: MEDICARE

## 2022-10-17 ENCOUNTER — Encounter: Admit: 2022-10-17 | Discharge: 2022-10-17 | Payer: MEDICARE

## 2023-01-16 ENCOUNTER — Ambulatory Visit: Admit: 2023-01-16 | Discharge: 2023-01-17 | Payer: MEDICARE

## 2023-03-25 ENCOUNTER — Encounter: Admit: 2023-03-25 | Discharge: 2023-03-25 | Payer: MEDICARE

## 2023-03-25 NOTE — Assessment & Plan Note
 His pacemaker doesn't sense all the atrial fib/flutter--I think his AF is persistent and permanent.  Not on anti-coagulation due to fall risk.  Not a candidate for Watchman due to age and patient/family preference.

## 2023-03-25 NOTE — Assessment & Plan Note
 No diuretic or medical therapy at this time.

## 2023-03-25 NOTE — Assessment & Plan Note
 02/2018 DDDR PM implant for sinus node dysfunction and sinus bradycardia.  Last device check 02/2023 showed about 5 years battery longevity, underlying atrial fib/flutter, 58% RV pacing.

## 2023-03-26 ENCOUNTER — Encounter: Admit: 2023-03-26 | Discharge: 2023-03-26 | Payer: MEDICARE

## 2023-03-26 ENCOUNTER — Ambulatory Visit: Admit: 2023-03-26 | Discharge: 2023-03-27 | Payer: MEDICARE

## 2023-03-26 DIAGNOSIS — I5032 Chronic diastolic (congestive) heart failure: Secondary | ICD-10-CM

## 2023-03-26 DIAGNOSIS — Z950 Presence of cardiac pacemaker: Secondary | ICD-10-CM

## 2023-03-26 DIAGNOSIS — I4821 Permanent atrial fibrillation: Secondary | ICD-10-CM

## 2023-03-26 NOTE — Progress Notes
 Date of Service: 03/26/2023    Edward Santana is a 89 y.o. male.       HPI     Edward Santana was in the [facility] today with one of his daughters.  He still lives in his own home.  His son and daughter-in-law live next-door and they help with his meals and general care.     He seems about the same as he was a year ago.  He denies any symptoms such as lightheadedness or syncope.  He has had no TIA or stroke symptoms.  He has had no falls over the last year.  He denies any chest discomfort.         Vitals:    03/26/23 0858   BP: (!) 154/84   BP Source: Arm, Left Upper   Pulse: 71   SpO2: 96%   PainSc: Zero   Weight: 52.9 kg (116 lb 9.6 oz)   Height: 157.5 cm (5' 2)     Body mass index is 21.33 kg/m?Marland Kitchen     Past Medical History  Patient Active Problem List    Diagnosis Date Noted    Cardiac pacemaker 02/04/2018     02/10/2018: Dual chamber pacemaker placement under moderate sedation (left chest St Jude) by Dr Bernette Mayers      Screening for cardiovascular condition 01/28/2018     08/2016 - MPI @ [facility]:  EF 63%.  No ischemia.      Bradycardia 01/04/2018     02/10/2018: Dual chamber pacemaker placement under moderate sedation (left chest St Jude) by Dr Bernette Mayers      Atrial fibrillation (CMS-HCC) 01/04/2018     08/2016 - EKG @ [facility] showed AF, rate 90  01/2017 - AF noted during [facility] OV.  A/C refused.  07/2017 - AF during office visit at [facility], patient refused A/C      Chronic diastolic heart failure (CMS-HCC) 01/04/2018     08/2016 - Echo @ [facility]:  EF 55%.  Mild MR.  PAP 26.  LA size normal      HTN (hypertension) 01/04/2018    Pre-diabetes 01/04/2018    Iron deficiency anemia 01/04/2018         Review of Systems   Constitutional: Negative.   HENT: Negative.     Eyes: Negative.    Cardiovascular: Negative.    Respiratory: Negative.     Endocrine: Negative.    Hematologic/Lymphatic: Negative.    Skin: Negative.    Musculoskeletal: Negative.    Gastrointestinal: Negative. Genitourinary: Negative.    Neurological: Negative.    Psychiatric/Behavioral: Negative.     Allergic/Immunologic: Negative.        Physical Exam    Physical Exam   General Appearance: no distress   Skin: warm, no ulcers or xanthomas   Digits and Nails: no cyanosis or clubbing   Eyes: conjunctivae and lids normal, pupils are equal and round   Teeth/Gums/Palate: dentition unremarkable, no lesions   Lips & Oral Mucosa: no pallor or cyanosis   Neck Veins: normal JVP , neck veins are not distended   Thyroid: no nodules, masses, tenderness or enlargement   Chest Inspection: chest is normal in appearance   Respiratory Effort: breathing comfortably, no respiratory distress   Auscultation/Percussion: lungs clear to auscultation, no rales or rhonchi, no wheezing   PMI: PMI not enlarged or displaced   Cardiac Rhythm: regular rhythm and normal rate   Cardiac Auscultation: S1, S2 normal, no rub, no gallop  Murmurs: no murmur   Peripheral Circulation: normal peripheral circulation   Carotid Arteries: normal carotid upstroke bilaterally, no bruits   Radial Arteries: normal symmetric radial pulses   Abdominal Aorta: no abdominal aortic bruit   Pedal Pulses: normal symmetric pedal pulses   Lower Extremity Edema: no lower extremity edema   Abdominal Exam: soft, non-tender, no masses, bowel sounds normal   Liver & Spleen: no organomegaly   Gait & Station: walks without assistance   Muscle Strength: normal muscle tone   Orientation: oriented to time, place and person   Affect & Mood: appropriate and sustained affect   Language and Memory: patient responsive and seems to comprehend information   Neurologic Exam: neurological assessment grossly intact   Other: moves all extremities        Cardiovascular Health Factors  Vitals BP Readings from Last 3 Encounters:   03/26/23 (!) 154/84   03/20/22 (!) 146/78   02/28/21 (!) 154/86     Wt Readings from Last 3 Encounters:   03/26/23 52.9 kg (116 lb 9.6 oz)   03/20/22 57.5 kg (126 lb 12.8 oz)   02/28/21 57.2 kg (126 lb 3.2 oz)     BMI Readings from Last 3 Encounters:   03/26/23 21.33 kg/m?   03/20/22 23.19 kg/m?   02/28/21 24.65 kg/m?      Smoking Social History     Tobacco Use   Smoking Status Never   Smokeless Tobacco Never      Lipid Profile Cholesterol   Date Value Ref Range Status   01/06/2020 178  Final     HDL   Date Value Ref Range Status   01/06/2020 47  Final     LDL   Date Value Ref Range Status   01/06/2020 115 (H) <100 Final     Triglycerides   Date Value Ref Range Status   01/06/2020 80  Final      Blood Sugar Hemoglobin A1C   Date Value Ref Range Status   01/04/2018 6.0 4.0 - 6.0 % Final     Comment:     The ADA recommends that most patients with type 1 and type 2 diabetes maintain   an A1c level <7%.       Glucose   Date Value Ref Range Status   02/19/2023 113 (H) 70 - 105 Final   01/26/2021 110 (H) 70 - 105 Final   01/06/2020 104  Final          Problems Addressed Today  Encounter Diagnoses   Name Primary?    Cardiac pacemaker Yes    Permanent atrial fibrillation (CMS-HCC)     Chronic diastolic heart failure (CMS-HCC)        Assessment and Plan       Cardiac pacemaker  02/2018 DDDR PM implant for sinus node dysfunction and sinus bradycardia.  Last device check 02/2023 showed about 5 years battery longevity, underlying atrial fib/flutter, 58% RV pacing.    Atrial fibrillation (CMS-HCC)  His pacemaker doesn't sense all the atrial fib/flutter--I think his AF is persistent and permanent.  Not on anti-coagulation due to fall risk.  Not a candidate for Watchman due to age and patient/family preference.    Chronic diastolic heart failure (CMS-HCC)  No diuretic or medical therapy at this time.    Current Medications (including today's revisions)   acetaminophen SR (TYLENOL) 650 mg tablet Take one tablet by mouth daily.    amLODIPine (NORVASC) 5 mg tablet Take one tablet by mouth daily. (  Patient taking differently: Take one-half tablet by mouth daily.)    aspirin EC 81 mg tablet Take one tablet by mouth daily. Take with food.    CALCIUM PO Take 1 tablet by mouth daily.    CHOLEcalciferoL (vitamin D3) (VITAMIN D3) 5000 unit tablet Take one tablet by mouth every 7 days.    diclofenac (VOLTAREN) 1 % topical gel Apply  topically to affected area twice daily.    ferrous sulfate (FEOSOL) 325 mg (65 mg iron) tablet Take one tablet by mouth twice daily. Take on an empty stomach at least 1 hour before or 2 hours after food. (Patient not taking: Reported on 03/26/2023)    MULTIVITAMIN PO Take 1 tablet by mouth daily.    polyethylene glycol 3350 (MIRALAX) 17 g packet Take two packets by mouth twice daily.    senna/docusate (SENOKOT-S) 8.6/50 mg tablet Take two tablets by mouth twice daily.     Total time spent on today's office visit was 30 minutes.  This includes face-to-face in person visit with patient as well as nonface-to-face time including review of the EMR, outside records, labs, radiologic studies, echocardiogram & other cardiovascular studies, formation of treatment plan, after visit summary, future disposition, and lastly on documentation.

## 2023-04-17 ENCOUNTER — Encounter: Admit: 2023-04-17 | Discharge: 2023-04-17

## 2023-04-17 DIAGNOSIS — Z950 Presence of cardiac pacemaker: Secondary | ICD-10-CM

## 2023-07-02 ENCOUNTER — Encounter: Admit: 2023-07-02 | Discharge: 2023-07-02 | Payer: MEDICARE

## 2023-07-02 DIAGNOSIS — I4821 Permanent atrial fibrillation: Secondary | ICD-10-CM

## 2023-07-02 DIAGNOSIS — Z950 Presence of cardiac pacemaker: Secondary | ICD-10-CM

## 2023-07-02 DIAGNOSIS — R001 Bradycardia, unspecified: Secondary | ICD-10-CM

## 2023-07-02 DIAGNOSIS — I5032 Chronic diastolic (congestive) heart failure: Secondary | ICD-10-CM

## 2023-07-23 ENCOUNTER — Encounter: Admit: 2023-07-23 | Discharge: 2023-07-23 | Payer: MEDICARE

## 2023-08-13 ENCOUNTER — Ambulatory Visit: Admit: 2023-08-13 | Discharge: 2023-08-13 | Payer: MEDICARE

## 2023-08-13 ENCOUNTER — Encounter: Admit: 2023-08-13 | Discharge: 2023-08-13 | Payer: MEDICARE

## 2023-10-18 ENCOUNTER — Encounter: Admit: 2023-10-18 | Discharge: 2023-10-18 | Payer: MEDICARE

## 2024-01-17 ENCOUNTER — Encounter: Admit: 2024-01-17 | Discharge: 2024-01-16 | Payer: MEDICARE
